# Patient Record
Sex: Female | Born: 1985
Health system: Southern US, Community
[De-identification: ages and names within clinical notes are randomized; demographics above are authoritative.]

## PROBLEM LIST (undated history)

## (undated) DIAGNOSIS — I10 Essential (primary) hypertension: Secondary | ICD-10-CM

## (undated) DIAGNOSIS — F32A Depression, unspecified: Secondary | ICD-10-CM

## (undated) DIAGNOSIS — F419 Anxiety disorder, unspecified: Secondary | ICD-10-CM

## (undated) DIAGNOSIS — N809 Endometriosis, unspecified: Secondary | ICD-10-CM

## (undated) DIAGNOSIS — F329 Major depressive disorder, single episode, unspecified: Secondary | ICD-10-CM

---

## 2009-05-12 ENCOUNTER — Emergency Department (HOSPITAL_COMMUNITY): Admission: EM | Admit: 2009-05-12 | Discharge: 2009-05-12 | Payer: Self-pay | Admitting: Emergency Medicine

## 2013-10-03 DIAGNOSIS — R8769 Abnormal cytological findings in specimens from other female genital organs: Secondary | ICD-10-CM | POA: Insufficient documentation

## 2014-01-03 DIAGNOSIS — N7093 Salpingitis and oophoritis, unspecified: Secondary | ICD-10-CM | POA: Insufficient documentation

## 2014-07-06 ENCOUNTER — Emergency Department (HOSPITAL_COMMUNITY)
Admission: EM | Admit: 2014-07-06 | Discharge: 2014-07-07 | Disposition: A | Discharge status: Home / Self Care | Payer: BC Managed Care – PPO | Attending: Emergency Medicine | Admitting: Emergency Medicine

## 2014-07-06 ENCOUNTER — Emergency Department (HOSPITAL_COMMUNITY): Payer: BC Managed Care – PPO

## 2014-07-06 ENCOUNTER — Encounter (HOSPITAL_COMMUNITY): Payer: Self-pay | Admitting: Emergency Medicine

## 2014-07-06 DIAGNOSIS — R079 Chest pain, unspecified: Secondary | ICD-10-CM | POA: Diagnosis present

## 2014-07-06 DIAGNOSIS — I10 Essential (primary) hypertension: Secondary | ICD-10-CM | POA: Insufficient documentation

## 2014-07-06 DIAGNOSIS — Z7901 Long term (current) use of anticoagulants: Secondary | ICD-10-CM | POA: Diagnosis not present

## 2014-07-06 DIAGNOSIS — R7989 Other specified abnormal findings of blood chemistry: Secondary | ICD-10-CM | POA: Insufficient documentation

## 2014-07-06 DIAGNOSIS — Z79899 Other long term (current) drug therapy: Secondary | ICD-10-CM | POA: Diagnosis not present

## 2014-07-06 DIAGNOSIS — R1110 Vomiting, unspecified: Secondary | ICD-10-CM | POA: Diagnosis not present

## 2014-07-06 DIAGNOSIS — I4891 Unspecified atrial fibrillation: Secondary | ICD-10-CM | POA: Insufficient documentation

## 2014-07-06 DIAGNOSIS — Z8742 Personal history of other diseases of the female genital tract: Secondary | ICD-10-CM | POA: Insufficient documentation

## 2014-07-06 HISTORY — DX: Essential (primary) hypertension: I10

## 2014-07-06 HISTORY — DX: Endometriosis, unspecified: N80.9

## 2014-07-06 LAB — I-STAT TROPONIN, ED: TROPONIN I, POC: 0 ng/mL (ref 0.00–0.08)

## 2014-07-06 LAB — BASIC METABOLIC PANEL
ANION GAP: 14 (ref 5–15)
BUN: 10 mg/dL (ref 6–23)
CO2: 23 mEq/L (ref 19–32)
Calcium: 9.3 mg/dL (ref 8.4–10.5)
Chloride: 106 mEq/L (ref 96–112)
Creatinine, Ser: 0.69 mg/dL (ref 0.50–1.10)
GFR calc Af Amer: >90 mL/min (ref >90)
GFR calc non Af Amer: >90 mL/min (ref >90)
GLUCOSE: 125 mg/dL — AB (ref 70–99)
POTASSIUM: 4 meq/L (ref 3.7–5.3)
SODIUM: 143 meq/L (ref 137–147)

## 2014-07-06 LAB — CBC
HCT: 40.1 % (ref 36.0–46.0)
HEMOGLOBIN: 13.6 g/dL (ref 12.0–15.0)
MCH: 27.4 pg (ref 26.0–34.0)
MCHC: 33.9 g/dL (ref 30.0–36.0)
MCV: 80.8 fL (ref 78.0–100.0)
PLATELETS: ADEQUATE 10*3/uL (ref 150–400)
RBC: 4.96 MIL/uL (ref 3.87–5.11)
RDW: 14.4 % (ref 11.5–15.5)
WBC: 12.1 10*3/uL — ABNORMAL HIGH (ref 4.0–10.5)

## 2014-07-06 LAB — D-DIMER, QUANTITATIVE: D-Dimer, Quant: 0.64 ug/mL-FEU — ABNORMAL HIGH (ref 0.00–0.48)

## 2014-07-06 LAB — MAGNESIUM: Magnesium: 1.8 mg/dL (ref 1.5–2.5)

## 2014-07-06 LAB — PROTIME-INR
INR: 1.22 (ref 0.00–1.49)
Prothrombin Time: 15.5 seconds — ABNORMAL HIGH (ref 11.6–15.2)

## 2014-07-06 LAB — PRO B NATRIURETIC PEPTIDE: PRO B NATRI PEPTIDE: 196.7 pg/mL — AB (ref 0–125)

## 2014-07-06 LAB — TSH: TSH: 3.1 u[IU]/mL (ref 0.350–4.500)

## 2014-07-06 MED ORDER — DILTIAZEM HCL 100 MG IV SOLR
5.0000 mg/h | Freq: Once | INTRAVENOUS | Status: AC
  Administered 2014-07-06: 5 mg/h via INTRAVENOUS

## 2014-07-06 MED ORDER — DILTIAZEM HCL 25 MG/5ML IV SOLN
15.0000 mg | Freq: Once | INTRAVENOUS | Status: AC
  Administered 2014-07-06: 15 mg via INTRAVENOUS

## 2014-07-06 MED ORDER — SODIUM CHLORIDE 0.9 % IV BOLUS (SEPSIS)
1000.0000 mL | Freq: Once | INTRAVENOUS | Status: AC
  Administered 2014-07-06: 1000 mL via INTRAVENOUS

## 2014-07-06 MED ORDER — IOHEXOL 350 MG/ML SOLN
80.0000 mL | Freq: Once | INTRAVENOUS | Status: AC | PRN
  Administered 2014-07-06: 80 mL via INTRAVENOUS

## 2014-07-06 MED ORDER — DILTIAZEM HCL 30 MG PO TABS
30.0000 mg | ORAL_TABLET | Freq: Once | ORAL | Status: AC
  Administered 2014-07-07: 30 mg via ORAL
  Filled 2014-07-06: qty 1

## 2014-07-06 NOTE — ED Provider Notes (Signed)
CSN: [id number]     Arrival date & time 07/06/14  1943 History   First MD Initiated Contact with Patient 07/06/14 2017     Chief Complaint  Patient presents with  . Chest Pain    (Consider location/radiation/quality/duration/timing/severity/associated sxs/prior Treatment) HPI Comments: Patient is a 28 year old female with a history of hypertension and endometriosis who presents to the emergency department for further evaluation of chest pain. Patient states that she came home at 0500 today and experienced one episode of emesis at 0600. She states that she then went to sleep and awoke later with a sharp pain in her left chest. She denies any radiation of the pain. Symptoms associated with palpitations characterized as a rapid heart beat. She states that she laid down to try and alleviate symptoms. She states that this improved her pain a bit, but she has noticed that her heart has been feeding at different rates throughout the course of the day. Patient states, "it feels like I'm running sometimes even when I'm standing still". She states that she was able to take a nap at 1800 and that her chest pain did not awake her with any discomfort. Patient has felt fatigued over the course of the day. She denies associated fever, syncope, near syncope, lightheadedness, dizziness, further nausea or vomiting, and shortness of breath. Patient with no history of recent travel, surgeries, or hospitalizations. She states that she is on hormone therapy since August 2015 for treatment of endometriosis; she cannot think of the name of the medication that she takes. Patient denies a history of arrhythmia or congenital heart abnormalities. Regarding FHx, she states that her father has a hx of a "weak heart muscles", but has never been on blood thinners.  Patient is a 28 y.o. female presenting with chest pain. The history is provided by the patient. No language interpreter was used.  Chest Pain Associated symptoms:  palpitations and vomiting   Associated symptoms: no dizziness, no fever, no nausea, no numbness, no shortness of breath and no weakness     Past Medical History  Diagnosis Date  . Hypertension   . Endometriosis    History reviewed. No pertinent past surgical history. No family history on file. History  Substance Use Topics  . Smoking status: Never Smoker   . Smokeless tobacco: Not on file  . Alcohol Use: No   OB History   Grav Para Term Preterm Abortions TAB SAB Ect Mult Living                  Review of Systems  Constitutional: Negative for fever.  Respiratory: Negative for shortness of breath.   Cardiovascular: Positive for chest pain and palpitations. Negative for leg swelling.  Gastrointestinal: Positive for vomiting. Negative for nausea.  Neurological: Negative for dizziness, syncope, weakness, light-headedness and numbness.  All other systems reviewed and are negative.   Allergies  Review of patient's allergies indicates no known allergies.  Home Medications   Prior to Admission medications   Medication Sig Start Date End Date Taking? Authorizing Provider  diltiazem (CARDIZEM) 30 MG tablet Take 1 tablet (30 mg total) by mouth 4 (four) times daily. 07/07/14   Antonietta Breach, PA-C  hydrochlorothiazide (HYDRODIURIL) 25 MG tablet Take 25 mg by mouth daily.   Yes Historical Provider, MD  norethindrone (AYGESTIN) 5 MG tablet Take 10 mg by mouth daily.  05/02/14  Yes Historical Provider, MD  rivaroxaban (XARELTO) 20 MG TABS tablet Take 1 tablet (20 mg total) by mouth daily with  supper. 07/07/14   Antonietta Breach, PA-C   BP 145/95  Pulse 108  Temp(Src) 98.1 F (36.7 C) (Oral)  Resp 18  Ht 5\' 7"  (1.702 m)  Wt 289 lb (131.09 kg)  BMI 45.25 kg/m2  SpO2 100%  Physical Exam  Nursing note and vitals reviewed. Constitutional: She is oriented to person, place, and time. She appears well-developed and well-nourished. No distress.   Nontoxic/nonseptic appearing  HENT:  Head:  Normocephalic and atraumatic.  Eyes: Conjunctivae and EOM are normal. No scleral icterus.  Neck: Normal range of motion. Neck supple.  No JVD  Cardiovascular: Normal heart sounds and intact distal pulses.  An irregularly irregular rhythm present.  Rate, by palpation, varies between regular and tachycardic  Pulmonary/Chest: Effort normal and breath sounds normal. No respiratory distress. She has no wheezes. She has no rales.  Chest expansion symmetric. No tachypnea or dyspnea.  Musculoskeletal: Normal range of motion.  Neurological: She is alert and oriented to person, place, and time. She exhibits normal muscle tone. Coordination normal.  Skin: Skin is warm and dry. No rash noted. She is not diaphoretic. No erythema. No pallor.  Psychiatric: She has a normal mood and affect. Her behavior is normal.    ED Course  Procedures (including critical care time) Labs Review Labs Reviewed  CBC - Abnormal; Notable for the following:    WBC 12.1 (*)    All other components within normal limits  BASIC METABOLIC PANEL - Abnormal; Notable for the following:    Glucose, Bld 125 (*)    All other components within normal limits  PRO B NATRIURETIC PEPTIDE - Abnormal; Notable for the following:    Pro B Natriuretic peptide (BNP) 196.7 (*)    All other components within normal limits  D-DIMER, QUANTITATIVE - Abnormal; Notable for the following:    D-Dimer, Quant 0.64 (*)    All other components within normal limits  PROTIME-INR - Abnormal; Notable for the following:    Prothrombin Time 15.5 (*)    All other components within normal limits  MAGNESIUM  TSH  I-STAT TROPOININ, ED    Imaging Review Dg Chest 2 View  07/06/2014   CLINICAL DATA:  Chest pain.  Initial encounter  EXAM: CHEST  2 VIEW  COMPARISON:  None.  FINDINGS: Generous heart size with normal morphology. Negative upper mediastinal contours. There is no edema, consolidation, effusion, or pneumothorax. No osseous findings to explain chest  pain.  IMPRESSION: 1. No active cardiopulmonary disease. 2. Borderline cardiomegaly.   Electronically Signed   By: Jorje Guild M.D.   On: 07/06/2014 20:30   Ct Angio Chest Pe W/cm &/or Wo Cm  07/07/2014   CLINICAL DATA:  Initial evaluation for intermittent left chest pain with palpitations and shortness of breath.  EXAM: CT ANGIOGRAPHY CHEST WITH CONTRAST  TECHNIQUE: Multidetector CT imaging of the chest was performed using the standard protocol during bolus administration of intravenous contrast. Multiplanar CT image reconstructions and MIPs were obtained to evaluate the vascular anatomy.  CONTRAST:  55mL OMNIPAQUE IOHEXOL 350 MG/ML SOLN  COMPARISON:  Prior radiograph performed earlier on the same day.  FINDINGS: Thyroid gland within normal limits.  No pathologically enlarged mediastinal, hilar, or axillary lymph nodes identified.  Intrathoracic aorta within normal limits. Great vessels are unremarkable.  Heart size is at the upper limits of normal. No pericardial effusion.  Pulmonary arterial tree is well opacified. No filling defect to suggest acute pulmonary embolism. Re-formatted imaging confirms these findings.  The lungs are clear  without focal infiltrate or pulmonary edema. Mild subsegmental atelectasis seen dependently within the bilateral lower lobes. No pneumothorax. No worrisome pulmonary nodule or mass.  Visualized portions of the upper abdomen within normal limits.  No acute osseous abnormality. No worrisome lytic or blastic osseous lesions.  Review of the MIP images confirms the above findings.  IMPRESSION: No CT evidence for acute pulmonary embolism or other acute cardiopulmonary abnormality.   Electronically Signed   By: Jeannine Boga M.D.   On: 07/07/2014 00:31     EKG Interpretation None      CRITICAL CARE Performed by: Antonietta Breach   Total critical care time: 45  Critical care time was exclusive of separately billable procedures and treating other  patients.  Critical care was necessary to treat or prevent imminent or life-threatening deterioration.  Critical care was time spent personally by me on the following activities: development of treatment plan with patient and/or surrogate as well as nursing, discussions with consultants, evaluation of patient's response to treatment, examination of patient, obtaining history from patient or surrogate, ordering and performing treatments and interventions, ordering and review of laboratory studies, ordering and review of radiographic studies, pulse oximetry and re-evaluation of patient's condition.  MDM   Final diagnoses:  Positive D dimer  Atrial fibrillation with RVR    28 y/o female presents to the ED for palpitations and chest pressure. Work up c/w Afib with RVR. Patient with rate control after Cardizem bolus and drip. Patient transitioned to oral Cardizem with continued rate control. Cardiac work up is reassuring; troponin WNL. No evidence of pulmonary embolus. TSH normal. Unknown cause of sudden onset of Afib.   Have consulted with Dr. Claiborne Billings of Cardiology. Patient elects to follow up with cardiology as an outpatient and Dr. Claiborne Billings, who has seen and evaluated the patient, believes patient is stable to do so. He has recommended initiation with Cardizem as well as Xarelto given that patient has a CHADs score of 2 for HTN and, likely, mild cardiomegaly and elevated BNP suggesting early CHF. Have instructed the patient to call cardiology in the morning as well as to return to the ED should she begin to experience similar symptoms again as she will likely require admission at this time. Patient agreeable to plan with no unaddressed concerns.   Filed Vitals:   07/07/14 0000 07/07/14 0033 07/07/14 0100 07/07/14 0229  BP: 140/86 128/75 153/99 145/95  Pulse: 104 87 99 108  Temp:    98.1 F (36.7 C)  TempSrc:    Oral  Resp: 18 18 18 18   Height:      Weight:      SpO2: 100% 100% 100% 100%       Antonietta Breach, PA-C 07/07/14 0251

## 2014-07-06 NOTE — ED Notes (Signed)
Pt. reports intermittent left chest pain with palpitations onset today with mild SOB and emesis yesterday .

## 2014-07-06 NOTE — ED Notes (Signed)
Pt ambulatory with steady gait from bathroom

## 2014-07-07 MED ORDER — DILTIAZEM HCL 30 MG PO TABS
30.0000 mg | ORAL_TABLET | Freq: Four times a day (QID) | ORAL | Status: DC
Start: 2014-07-07 — End: 2014-07-15

## 2014-07-07 MED ORDER — RIVAROXABAN (XARELTO) EDUCATION KIT FOR DVT/PE PATIENTS
PACK | Freq: Once | Status: DC
  Filled 2014-07-07: qty 1

## 2014-07-07 MED ORDER — RIVAROXABAN 20 MG PO TABS
20.0000 mg | ORAL_TABLET | Freq: Every day | ORAL | Status: DC
Start: 2014-07-07 — End: 2014-07-15

## 2014-07-07 NOTE — ED Notes (Signed)
Cardiology at bedside.

## 2014-07-07 NOTE — Discharge Instructions (Signed)

## 2014-07-07 NOTE — Consult Note (Signed)
Reason for Consult: new onset atrial fibrillation Referring Physician: ER   Cathy Bryan is an 28 y.o. female.  HPI: Ms. Cathy Bryan is a 28 yo woman with PMH of HTN and endometriosis on progesterone who presents with chest pain/palpitations today. She came home at 0500 and had some nausea/vomiting at 06:00 after a night of some drinking and marijuana (only occasional use). She slept in and woke up with some sharp pain in her chest as well as fast rapid heart rate/palpitations. She's never had symptoms like this before. She tried sleeping and rest throughout the day without improvement leading to ER presentation. No infectious symptoms, no sick contacts, no fever/chills/diarrhea. She's been on proegsterone since August 2015 for endometriosis. No family history of CAD or atrial fibrillation. No bleeding issues. We discussed the causes/triggers of atrial fibrillation, risk factors (stroke) and typical treatment. She was accompanied by her girlfriend and a woman she considers her second mother.   Past Medical History  Diagnosis Date  . Hypertension   . Endometriosis     History reviewed. No pertinent past surgical history.  No family history on file.  Social History:  reports that she has never smoked. She does not have any smokeless tobacco history on file. She reports that she does not drink alcohol or use illicit drugs.  Allergies: No Known Allergies  Medications: I have reviewed the patient's current medications. Prior to Admission:  (Not in a hospital admission) Scheduled:  Results for orders placed during the hospital encounter of 07/06/14 (from the past 48 hour(s))  CBC     Status: Abnormal   Collection Time    07/06/14  8:10 PM      Result Value Ref Range   WBC 12.1 (*) 4.0 - 10.5 K/uL   Comment: WHITE COUNT CONFIRMED ON SMEAR   RBC 4.96  3.87 - 5.11 MIL/uL   Hemoglobin 13.6  12.0 - 15.0 g/dL   HCT 40.1  36.0 - 46.0 %   MCV 80.8  78.0 - 100.0 fL   MCH 27.4  26.0 - 34.0  pg   MCHC 33.9  30.0 - 36.0 g/dL   RDW 14.4  11.5 - 15.5 %   Platelets    150 - 400 K/uL   Value: PLATELET CLUMPS NOTED ON SMEAR, COUNT APPEARS ADEQUATE  BASIC METABOLIC PANEL     Status: Abnormal   Collection Time    07/06/14  8:10 PM      Result Value Ref Range   Sodium 143  137 - 147 mEq/L   Potassium 4.0  3.7 - 5.3 mEq/L   Chloride 106  96 - 112 mEq/L   CO2 23  19 - 32 mEq/L   Glucose, Bld 125 (*) 70 - 99 mg/dL   BUN 10  6 - 23 mg/dL   Creatinine, Ser 0.69  0.50 - 1.10 mg/dL   Calcium 9.3  8.4 - 10.5 mg/dL   GFR calc non Af Amer >90  >90 mL/min   GFR calc Af Amer >90  >90 mL/min   Comment: (NOTE)     The eGFR has been calculated using the CKD EPI equation.     This calculation has not been validated in all clinical situations.     eGFR's persistently <90 mL/min signify possible Chronic Kidney     Disease.   Anion gap 14  5 - 15  PRO B NATRIURETIC PEPTIDE     Status: Abnormal   Collection Time    07/06/14  8:10 PM  Result Value Ref Range   Pro B Natriuretic peptide (BNP) 196.7 (*) 0 - 125 pg/mL  I-STAT TROPOININ, ED     Status: None   Collection Time    07/06/14  8:19 PM      Result Value Ref Range   Troponin i, poc 0.00  0.00 - 0.08 ng/mL   Comment 3            Comment: Due to the release kinetics of cTnI,     a negative result within the first hours     of the onset of symptoms does not rule out     myocardial infarction with certainty.     If myocardial infarction is still suspected,     repeat the test at appropriate intervals.  D-DIMER, QUANTITATIVE     Status: Abnormal   Collection Time    07/06/14  8:51 PM      Result Value Ref Range   D-Dimer, Quant 0.64 (*) 0.00 - 0.48 ug/mL-FEU   Comment:            AT THE INHOUSE ESTABLISHED CUTOFF     VALUE OF 0.48 ug/mL FEU,     THIS ASSAY HAS BEEN DOCUMENTED     IN THE LITERATURE TO HAVE     A SENSITIVITY AND NEGATIVE     PREDICTIVE VALUE OF AT LEAST     98 TO 99%.  THE TEST RESULT     SHOULD BE CORRELATED  WITH     AN ASSESSMENT OF THE CLINICAL     PROBABILITY OF DVT / VTE.  PROTIME-INR     Status: Abnormal   Collection Time    07/06/14  9:40 PM      Result Value Ref Range   Prothrombin Time 15.5 (*) 11.6 - 15.2 seconds   INR 1.22  0.00 - 1.49  MAGNESIUM     Status: None   Collection Time    07/06/14  9:40 PM      Result Value Ref Range   Magnesium 1.8  1.5 - 2.5 mg/dL  TSH     Status: None   Collection Time    07/06/14  9:40 PM      Result Value Ref Range   TSH 3.100  0.350 - 4.500 uIU/mL    Dg Chest 2 View  07/06/2014   CLINICAL DATA:  Chest pain.  Initial encounter  EXAM: CHEST  2 VIEW  COMPARISON:  None.  FINDINGS: Generous heart size with normal morphology. Negative upper mediastinal contours. There is no edema, consolidation, effusion, or pneumothorax. No osseous findings to explain chest pain.  IMPRESSION: 1. No active cardiopulmonary disease. 2. Borderline cardiomegaly.   Electronically Signed   By: Jorje Guild M.D.   On: 07/06/2014 20:30   Ct Angio Chest Pe W/cm &/or Wo Cm  07/07/2014   CLINICAL DATA:  Initial evaluation for intermittent left chest pain with palpitations and shortness of breath.  EXAM: CT ANGIOGRAPHY CHEST WITH CONTRAST  TECHNIQUE: Multidetector CT imaging of the chest was performed using the standard protocol during bolus administration of intravenous contrast. Multiplanar CT image reconstructions and MIPs were obtained to evaluate the vascular anatomy.  CONTRAST:  12m OMNIPAQUE IOHEXOL 350 MG/ML SOLN  COMPARISON:  Prior radiograph performed earlier on the same day.  FINDINGS: Thyroid gland within normal limits.  No pathologically enlarged mediastinal, hilar, or axillary lymph nodes identified.  Intrathoracic aorta within normal limits. Great vessels are unremarkable.  Heart size is at the upper  limits of normal. No pericardial effusion.  Pulmonary arterial tree is well opacified. No filling defect to suggest acute pulmonary embolism. Re-formatted imaging  confirms these findings.  The lungs are clear without focal infiltrate or pulmonary edema. Mild subsegmental atelectasis seen dependently within the bilateral lower lobes. No pneumothorax. No worrisome pulmonary nodule or mass.  Visualized portions of the upper abdomen within normal limits.  No acute osseous abnormality. No worrisome lytic or blastic osseous lesions.  Review of the MIP images confirms the above findings.  IMPRESSION: No CT evidence for acute pulmonary embolism or other acute cardiopulmonary abnormality.   Electronically Signed   By: Jeannine Boga M.D.   On: 07/07/2014 00:31    Review of Systems  Constitutional: Negative for fever, chills, malaise/fatigue and diaphoresis.  HENT: Negative for hearing loss and tinnitus.   Eyes: Negative for double vision, photophobia and pain.  Respiratory: Negative for cough and shortness of breath.   Cardiovascular: Positive for palpitations. Negative for chest pain, orthopnea, claudication and leg swelling.  Gastrointestinal: Positive for vomiting. Negative for abdominal pain, diarrhea, blood in stool and melena.  Genitourinary: Negative for dysuria and flank pain.  Musculoskeletal: Negative for joint pain and myalgias.  Neurological: Negative for dizziness and headaches.  Endo/Heme/Allergies: Negative for polydipsia. Does not bruise/bleed easily.  Psychiatric/Behavioral: Negative for depression, suicidal ideas and hallucinations.   Blood pressure 153/99, pulse 99, temperature 98.7 F (37.1 C), temperature source Oral, resp. rate 18, height 5' 7"  (1.702 m), weight 131.09 kg (289 lb), SpO2 100.00%. Physical Exam  Nursing note and vitals reviewed. Constitutional: She is oriented to person, place, and time. She appears well-developed and well-nourished. No distress.  HENT:  Head: Normocephalic and atraumatic.  Nose: Nose normal.  Mouth/Throat: Oropharynx is clear and moist. No oropharyngeal exudate.  Eyes: Conjunctivae and EOM are  normal. Pupils are equal, round, and reactive to light. No scleral icterus.  Neck: Normal range of motion. Neck supple. No JVD present. No tracheal deviation present.  Cardiovascular: Normal heart sounds and intact distal pulses.  Exam reveals no gallop.   No murmur heard. Irregularly irregular HR 90s-100s  Respiratory: Effort normal and breath sounds normal. No respiratory distress. She has no wheezes. She has no rales.  GI: Soft. Bowel sounds are normal. She exhibits no distension. There is no tenderness. There is no rebound.  Musculoskeletal: Normal range of motion. She exhibits no edema and no tenderness.  Neurological: She is alert and oriented to person, place, and time. No cranial nerve deficit. Coordination normal.  Skin: Skin is warm and dry. No rash noted. She is not diaphoretic. No erythema.  Psychiatric: She has a normal mood and affect. Her behavior is normal. Thought content normal.  labs reviewed; tsh unrevealing D-dimer 0.64, CTA unrevealing for PE ECG: atrial fibrillation Assessment/Plan: Cathy Bryan is a 28 yo woman with hypertension and endometriosis who presents with palpitations after a late night out partying who is found to have new onset atrial fibrillation. I discussed the risks of atrial fibrillation (stroke) and etiologies including thyroid disorders, infections, toxins, heart failure, among other etiologies. I recommended inpatient stay to pursue TEE-DCCV given first occurrence of atrial fibrillation but she wants to discharge and return soon for follow-up. I am hopeful she will spontaneously convert. Her CHADS2VASC is 2 (female and hypertension) so I recommend xarelto given once daily administration.  - follow-up ASAP, patient has number to call to confirm appointment - rate control with diltiazem - anticoagulation with xarelto - TTE at time of  follow-up to assess EF   Hiliary Osorto 07/07/2014, 1:26 AM

## 2014-07-07 NOTE — ED Notes (Signed)
Pt speaking with pharmacist via telephone regarding new prescription for xarelto

## 2014-07-07 NOTE — ED Notes (Signed)
Dr. Nanavati at bedside 

## 2014-07-07 NOTE — ED Provider Notes (Signed)
Medical screening examination/treatment/procedure(s) were conducted as a shared visit with non-physician practitioner(s) and myself.  I personally evaluated the patient during the encounter.   EKG Interpretation   Date/Time:  Sunday July 06 2014 22:49:55 EDT Ventricular Rate:  81 PR Interval:    QRS Duration: 87 QT Interval:  359 QTC Calculation: 417 R Axis:   79 Text Interpretation:  Atrial fibrillation Borderline repolarization  abnormality Improved rate, with flutter waves Reconfirmed by Khayri Kargbo, MD,  Marissa Weaver ([id number]) on 07/07/2014 9:50:17 AM       Pt comes in with chest discomfort. Noted to be in afib with RVR at rate of 150. Pt given dilt bolus, started on drip there after. HR improved to < 110. Drip weaned off whilst in the ER, and oral cardiazem started, and HR maintained to < 100. CHADS2 score is 1 for sure, for HTN, however, BNP was elevated, CHF can't be ruled out, and so pt was given Xarelto. Cards OK with discharge, and close f/u, and i spoke with the Cardiology team this AM, to get pt in the clinic soon. Return precautions discussed, including return of palpitations. Pt also advised on risk of stroke being higher, and complications of xarelto. Will d.c.  CRITICAL CARE Performed by: Varney Biles   Total critical care time: 32 min  Critical care time was exclusive of separately billable procedures and treating other patients.  Critical care was necessary to treat or prevent imminent or life-threatening deterioration.  Critical care was time spent personally by me on the following activities: development of treatment plan with patient and/or surrogate as well as nursing, discussions with consultants, evaluation of patient's response to treatment, examination of patient, obtaining history from patient or surrogate, ordering and performing treatments and interventions, ordering and review of laboratory studies, ordering and review of radiographic studies, pulse  oximetry and re-evaluation of patient's condition.   Varney Biles, MD 07/07/14 [phone]

## 2014-07-10 ENCOUNTER — Encounter: Payer: Self-pay | Admitting: *Deleted

## 2014-07-15 ENCOUNTER — Ambulatory Visit (INDEPENDENT_AMBULATORY_CARE_PROVIDER_SITE_OTHER): Payer: BC Managed Care – PPO | Admitting: Internal Medicine

## 2014-07-15 ENCOUNTER — Encounter: Payer: Self-pay | Admitting: Internal Medicine

## 2014-07-15 VITALS — BP 130/96 | HR 87 | Ht 67.0 in | Wt 285.2 lb

## 2014-07-15 DIAGNOSIS — I4891 Unspecified atrial fibrillation: Secondary | ICD-10-CM

## 2014-07-15 DIAGNOSIS — G478 Other sleep disorders: Secondary | ICD-10-CM

## 2014-07-15 DIAGNOSIS — G4730 Sleep apnea, unspecified: Secondary | ICD-10-CM

## 2014-07-15 DIAGNOSIS — I10 Essential (primary) hypertension: Secondary | ICD-10-CM

## 2014-07-15 LAB — HEMOGLOBIN A1C: Hgb A1c MFr Bld: 5.7 % (ref 4.6–6.5)

## 2014-07-15 NOTE — Patient Instructions (Addendum)
Your physician has recommended you make the following change in your medication:  1) STOP Diltiazem 2) STOP Xarelto  Lab today: A1C  Your physician has requested that you have an echocardiogram. Echocardiography is a painless test that uses sound waves to create images of your heart. It provides your doctor with information about the size and shape of your heart and how well your heart's chambers and valves are working. This procedure takes approximately one hour. There are no restrictions for this procedure.  We will call you with the results.  We will see you on an as needed basis.  Continue with sleep study plans. Increase exercise. Weight loss.

## 2014-07-15 NOTE — Progress Notes (Signed)
ELECTROPHYSIOLOGY CONSULT NOTE  Patient ID: Cathy Bryan, MRN: [id number], DOB/AGE: [birth year] 28 y.o. Admit date: (Not on file) Date of Consult: 07/15/2014  Primary Physician: No PCP Per Patient Primary Cardiologist: None  Chief Complaint: Atrial Fibrillation   HPI Cathy Bryan is a 28 y.o. female with history of morbid obesity, HTN, snoring, who presented to the ER, 10/25  for complaints of tachycardia and chest pain. Pt was first aware of symptoms upon awaking after a night of binge drinking and marijuana use. She states this is not her usual behavior but had attended homing the night before. She was placed on Xarelto and diltiazem and d/c in rate controlled afib. She noticed the next day that heart rhythm felt normal and has noticed any further arrhythmia. She saw an ENT and is scheduled for a sleep study. Labs in the ER revealed glucose (nonfasting ) of 125, TSH, magnesium,troponin, H&H nml, with positive d-dimer but nml CT, BNP slightly elevated at 196.7. It was suggested to obtain echo on an OP basis. She was discharged on diltiazem 30 qid as well as Xarelto 20 mg qd for CHA2DS2VASc of 2(Htn,female) . Past Medical History  Diagnosis Date  . Hypertension   . Endometriosis       Surgical History: History reviewed. No pertinent past surgical history.   Home Meds: Prior to Admission medications   Medication Sig Start Date End Date Taking? Authorizing Provider  diltiazem (CARDIZEM) 30 MG tablet Take 1 tablet (30 mg total) by mouth 4 (four) times daily. 07/07/14  Yes Antonietta Breach, PA-C  hydrochlorothiazide (HYDRODIURIL) 25 MG tablet Take 25 mg by mouth daily.   Yes Historical Provider, MD  norethindrone (AYGESTIN) 5 MG tablet Take 10 mg by mouth daily.  05/02/14  Yes Historical Provider, MD  rivaroxaban (XARELTO) 20 MG TABS tablet Take 1 tablet (20 mg total) by mouth daily with supper. 07/07/14  Yes Antonietta Breach, PA-C    Inpatient Medications:  Diltiazem IV  Allergies: No Known  Allergies  History   Social History  . Marital Status: Single    Spouse Name: N/A    Number of Children: N/A  . Years of Education: N/A   Occupational History  . Not on file.   Social History Main Topics  . Smoking status: Never Smoker   . Smokeless tobacco: Not on file  . Alcohol Use: Yes  . Drug Use: Yes    Special: Marijuana  . Sexual Activity: Not on file   Other Topics Concern  . Not on file   Social History Narrative     Family History  Problem Relation Age of Onset  . Hypertension Mother   . Hypertension Father   . Diabetes Father      ROS:  Please see the history of present illness.   History obtained from chart review and the patient  All other systems reviewed and negative.    Physical Exam: Blood pressure 130/96, pulse 87, height 5\' 7"  (1.702 m), weight 285 lb 3.2 oz (129.366 kg). General: Well developed, well nourished female in no acute distress. Head: Normocephalic, atraumatic, sclera non-icteric, no xanthomas, nares are without discharge. EENT: normal Lymph Nodes:  none Back: without scoliosis/kyphosis, no CVA tendersness Neck: Negative for carotid bruits. JVD not elevated. Lungs: Clear bilaterally to auscultation without wheezes, rales, or rhonchi. Breathing is unlabored. Heart: RRR with S1 S2. No murmur,no rubs, or gallops appreciated. Abdomen: Soft, non-tender, non-distended with normoactive bowel sounds. No hepatomegaly. No rebound/guarding. No obvious abdominal masses. Msk:  Strength and tone appear normal for age. Extremities: No clubbing or cyanosis. Trace lower extremity edema.  Distal pedal pulses are 2+ and equal bilaterally. Skin: Warm and Dry Neuro: Alert and oriented X 3. CN III-XII intact Grossly normal sensory and motor function . Psych:  Responds to questions appropriately with a normal affect.      Labs: Cardiac Enzymes No results for input(s): CKTOTAL, CKMB, TROPONINI in the last 72 hours. CBC Lab Results  Component Value  Date   WBC 12.1* 07/06/2014   HGB 13.6 07/06/2014   HCT 40.1 07/06/2014   MCV 80.8 07/06/2014   PLT  07/06/2014    PLATELET CLUMPS NOTED ON SMEAR, COUNT APPEARS ADEQUATE   PROTIME: No results for input(s): LABPROT, INR in the last 72 hours. Chemistry No results for input(s): NA, K, CL, CO2, BUN, CREATININE, CALCIUM, PROT, BILITOT, ALKPHOS, ALT, AST, GLUCOSE in the last 168 hours.  Invalid input(s): LABALBU Lipids No results found for: CHOL, HDL, LDLCALC, TRIG BNP PRO B NATRIURETIC PEPTIDE (BNP)  Date/Time Value Ref Range Status  07/06/2014 08:10 PM 196.7* 0 - 125 pg/mL Final   Miscellaneous Lab Results  Component Value Date   DDIMER 0.64* 07/06/2014    Radiology/Studies:  Dg Chest 2 View  07/06/2014   CLINICAL DATA:  Chest pain.  Initial encounter  EXAM: CHEST  2 VIEW  COMPARISON:  None.  FINDINGS: Generous heart size with normal morphology. Negative upper mediastinal contours. There is no edema, consolidation, effusion, or pneumothorax. No osseous findings to explain chest pain.  IMPRESSION: 1. No active cardiopulmonary disease. 2. Borderline cardiomegaly.   Electronically Signed   By: Jorje Guild M.D.   On: 07/06/2014 20:30   Ct Angio Chest Pe W/cm &/or Wo Cm  07/07/2014   CLINICAL DATA:  Initial evaluation for intermittent left chest pain with palpitations and shortness of breath.  EXAM: CT ANGIOGRAPHY CHEST WITH CONTRAST  TECHNIQUE: Multidetector CT imaging of the chest was performed using the standard protocol during bolus administration of intravenous contrast. Multiplanar CT image reconstructions and MIPs were obtained to evaluate the vascular anatomy.  CONTRAST:  87mL OMNIPAQUE IOHEXOL 350 MG/ML SOLN  COMPARISON:  Prior radiograph performed earlier on the same day.  FINDINGS: Thyroid gland within normal limits.  No pathologically enlarged mediastinal, hilar, or axillary lymph nodes identified.  Intrathoracic aorta within normal limits. Great vessels are unremarkable.   Heart size is at the upper limits of normal. No pericardial effusion.  Pulmonary arterial tree is well opacified. No filling defect to suggest acute pulmonary embolism. Re-formatted imaging confirms these findings.  The lungs are clear without focal infiltrate or pulmonary edema. Mild subsegmental atelectasis seen dependently within the bilateral lower lobes. No pneumothorax. No worrisome pulmonary nodule or mass.  Visualized portions of the upper abdomen within normal limits.  No acute osseous abnormality. No worrisome lytic or blastic osseous lesions.  Review of the MIP images confirms the above findings.  IMPRESSION: No CT evidence for acute pulmonary embolism or other acute cardiopulmonary abnormality.   Electronically Signed   By: Jeannine Boga M.D.   On: 07/07/2014 00:31    EKG:NSR at 87 bpm.   Assessment and Plan: 1. PAF- currently in SR  Discussed in length lifestyle and contributing factors such as use of alcohol and illicit drugs, snoring history, obesity,inactivity and HTN.  Pt is pending sleep study  Echocardiogram to further assess for abnormal structure that would contribute to afib. Will be called report, returning   to office only if  abnormal.  HgA1C today  to assess for undiagnosed DM  Stop diltiazem  Stop NOAC- decision not to continue long term anticoagulation based on recent Article in [facility] -comparing ATRIA score to determine stroke risk vrs chads and CHA2DS2VASc,found to more accurate to identifiy low risk patients, preventing overuse of anticoagulants,, with HTN and sex assigned to low risk values as well as patients young age being protective.  See prn, returning for any recurrent afib.  Virl Axe

## 2014-07-18 ENCOUNTER — Ambulatory Visit (HOSPITAL_COMMUNITY): Payer: BC Managed Care – PPO | Attending: Internal Medicine | Admitting: Cardiology

## 2014-07-18 DIAGNOSIS — I4891 Unspecified atrial fibrillation: Secondary | ICD-10-CM | POA: Insufficient documentation

## 2014-07-18 NOTE — Progress Notes (Signed)
Echo performed. 

## 2014-08-11 ENCOUNTER — Ambulatory Visit (HOSPITAL_BASED_OUTPATIENT_CLINIC_OR_DEPARTMENT_OTHER): Payer: BC Managed Care – PPO

## 2014-08-26 ENCOUNTER — Ambulatory Visit (HOSPITAL_BASED_OUTPATIENT_CLINIC_OR_DEPARTMENT_OTHER): Payer: BC Managed Care – PPO | Attending: Otolaryngology | Admitting: Radiology

## 2014-08-26 DIAGNOSIS — G4710 Hypersomnia, unspecified: Secondary | ICD-10-CM

## 2014-08-26 DIAGNOSIS — Z6841 Body Mass Index (BMI) 40.0 and over, adult: Secondary | ICD-10-CM | POA: Insufficient documentation

## 2014-08-26 DIAGNOSIS — R0683 Snoring: Secondary | ICD-10-CM

## 2014-08-26 DIAGNOSIS — G4730 Sleep apnea, unspecified: Secondary | ICD-10-CM | POA: Insufficient documentation

## 2014-08-26 NOTE — Sleep Study (Signed)
THE PATIENT ARRIVED TO THE [facility] AT 1300 IN NOTED DISTRESS.THE PATIENT IS SCHEDULED TO UNDERGO A HSAT.THE PATIENT TECH DEMONSTRATED THE HOW TO APPLY THE DEVICE.THE PATIENT PRESENTED CLEAR UNDERSTANDING OF THE APPLICATION.THE PATIENT WAS INSTRUCTED TO RETURN THE DEVICE ON THE NEXT BUSINESS DAY. THE PATIENT AGREED.

## 2014-08-30 DIAGNOSIS — G4710 Hypersomnia, unspecified: Secondary | ICD-10-CM

## 2014-08-30 DIAGNOSIS — R0683 Snoring: Secondary | ICD-10-CM

## 2014-08-30 DIAGNOSIS — G4730 Sleep apnea, unspecified: Secondary | ICD-10-CM

## 2014-08-30 NOTE — Sleep Study (Signed)
    NAME: Cathy Bryan DATE OF BIRTH:  [birth year] MEDICAL RECORD NUMBER [id number]  LOCATION: [facility]  PHYSICIAN: YOUNG,CLINTON D  DATE OF STUDY: 08/26/2014  SLEEP STUDY TYPE: Out of Center Sleep Test                REFERRING PHYSICIAN: Jodi Marble, MD  INDICATION FOR STUDY: Hypersomnia with sleep apnea  EPWORTH SLEEPINESS SCORE:  9/24  HEIGHT:    5 feet 7 inches WEIGHT:    289 pounds   BMI 45 NECK SIZE:  15.5 in.  MED: Charted for review  IMPRESSION:   Moderate obstructive sleep apnea/hypopnea syndrome, AHI 19.3 per hour. 106 total events scored including 5 central apneas, 8 obstructive apneas, what apnea, 92 hypopneas. Sleep position was not specified. Oxygen desaturation to a nadir of 83% and mean saturation 96% on room air. Mean heart rate 85.1/m.    RECOMMENDATION:   Scores in this range are most commonly addressed first with CPAP. Other interventions may be appropriate on an individual basis.   Deneise Lever Diplomate, American Board of Sleep Medicine  ELECTRONICALLY SIGNED ON:  08/30/2014, 9:55 AM [place] PH: [phone]   FX: [phone] [place]

## 2014-09-11 ENCOUNTER — Other Ambulatory Visit (HOSPITAL_BASED_OUTPATIENT_CLINIC_OR_DEPARTMENT_OTHER): Payer: BC Managed Care – PPO

## 2014-09-11 ENCOUNTER — Ambulatory Visit (INDEPENDENT_AMBULATORY_CARE_PROVIDER_SITE_OTHER): Payer: BC Managed Care – PPO | Admitting: Family Medicine

## 2014-09-11 ENCOUNTER — Ambulatory Visit (INDEPENDENT_AMBULATORY_CARE_PROVIDER_SITE_OTHER): Payer: BC Managed Care – PPO

## 2014-09-11 VITALS — BP 142/98 | HR 100 | Temp 98.1°F | Resp 18 | Ht 67.0 in | Wt 287.6 lb

## 2014-09-11 DIAGNOSIS — R1031 Right lower quadrant pain: Secondary | ICD-10-CM

## 2014-09-11 DIAGNOSIS — Z8742 Personal history of other diseases of the female genital tract: Secondary | ICD-10-CM

## 2014-09-11 DIAGNOSIS — K625 Hemorrhage of anus and rectum: Secondary | ICD-10-CM

## 2014-09-11 DIAGNOSIS — D72829 Elevated white blood cell count, unspecified: Secondary | ICD-10-CM

## 2014-09-11 DIAGNOSIS — K5900 Constipation, unspecified: Secondary | ICD-10-CM

## 2014-09-11 LAB — POCT CBC
Granulocyte percent: 70.9 %G (ref 37–80)
HCT, POC: 46.1 % (ref 37.7–47.9)
HEMOGLOBIN: 14.9 g/dL (ref 12.2–16.2)
LYMPH, POC: 3.6 — AB (ref 0.6–3.4)
MCH: 27 pg (ref 27–31.2)
MCHC: 32.2 g/dL (ref 31.8–35.4)
MCV: 83.9 fL (ref 80–97)
MID (cbc): 0.9 (ref 0–0.9)
MPV: 9 fL (ref 0–99.8)
POC Granulocyte: 11 — AB (ref 2–6.9)
POC LYMPH PERCENT: 23.2 %L (ref 10–50)
POC MID %: 5.9 %M (ref 0–12)
Platelet Count, POC: 273 10*3/uL (ref 142–424)
RBC: 5.5 M/uL — AB (ref 4.04–5.48)
RDW, POC: 13.6 %
WBC: 15.5 10*3/uL — AB (ref 4.6–10.2)

## 2014-09-11 LAB — IFOBT (OCCULT BLOOD): IFOBT: NEGATIVE

## 2014-09-11 NOTE — Progress Notes (Signed)
Subjective: 36 show lady who has been having problems with being a little bit constipated the last few days. She has had some pain in the epigastrium and moves more down to the low abdomen. She hurts more on the right side. She does have a history of endometriosis. She has had no nausea or vomiting. She says is improved a little bit with her bowels been taking a lot of Pepto-Bismol and magnesium. This morning she noticed red blood with her stool when her bowels did move a little bit. She had to strain a lot initially, then moved a little.  Objective: Vital signs stable. No acute distress. Chest clear. Heart regular without murmurs. Abdomen has normal bowel sounds. Soft without masses or but has some mild generalized tenderness from the epigastrium and low abdomen and more in the right lower quadrant. . Rectal exam revealed a little spot of red just to the left the anus, but no source of bleeding. No hemorrhoids. Digital exam was palpably normal. However the examining glove does have some red blood in the mucus on it.  Assessment: Red rectal bleeding Constipation Abdominal pain, right lower quadrant  Plan: CBC, abdominal x-ray  Results for orders placed or performed in visit on 09/11/14  IFOBT POC (occult bld, rslt in office)  Result Value Ref Range   IFOBT Negative   POCT CBC  Result Value Ref Range   WBC 15.5 (A) 4.6 - 10.2 K/uL   Lymph, poc 3.6 (A) 0.6 - 3.4   POC LYMPH PERCENT 23.2 10 - 50 %L   MID (cbc) 0.9 0 - 0.9   POC MID % 5.9 0 - 12 %M   POC Granulocyte 11.0 (A) 2 - 6.9   Granulocyte percent 70.9 37 - 80 %G   RBC 5.50 (A) 4.04 - 5.48 M/uL   Hemoglobin 14.9 12.2 - 16.2 g/dL   HCT, POC 46.1 37.7 - 47.9 %   MCV 83.9 80 - 97 fL   MCH, POC 27.0 27 - 31.2 pg   MCHC 32.2 31.8 - 35.4 g/dL   RDW, POC 13.6 %   Platelet Count, POC 273.0 142 - 424 K/uL   MPV 9.0 0 - 99.8 fL   [facility] reading (PRIMARY) by  Dr. Linna Darner Obvious stool in ascending and transverse colon, radiopaque, probably  from all the Pepto-Bismol. No abnormal gas patterns noted.  Assessment: With the leukocytosis need to rule out an acute abdomen process. This is probably just from the bed constipation in the straining causing her bleeding somehow. She could have a diverticulitis though less likely at her age. Recent CT scan.  Patient reluctant to go get CT scan. It was a very long process getting the CT approved, and it was approved [facility] [address] [facility]. It sounds like the patient may not go there. I told her I cannot make her go, but also her appendix could rupture and she did get very severely ill. She was urged strongly to go.

## 2014-09-11 NOTE — Patient Instructions (Signed)
We are sending you for a stat CT scan of your abdomen.   If you have evidence of appendicitis or other surgical concerns you may be sent to the emergency room.  If you have not no major concerns on the CT scan, we will treat you for constipation. Take MiraLAX one dose daily until stools are loose, then decrease to every 2-3 days until bowels are more regular. Drink lots of fluids. Avoid alcohol. Avoid excessive cheese and bananas.  Return in about 10-14 days for a recheck. If you have heavier bleeding or persistent bleeding come in sooner. If you get acutely worse go to the emergency room.

## 2015-04-12 ENCOUNTER — Emergency Department (HOSPITAL_COMMUNITY)
Admission: EM | Admit: 2015-04-12 | Discharge: 2015-04-12 | Disposition: A | Discharge status: Home / Self Care | Payer: Self-pay | Attending: Emergency Medicine | Admitting: Emergency Medicine

## 2015-04-12 ENCOUNTER — Emergency Department (HOSPITAL_COMMUNITY): Payer: Self-pay

## 2015-04-12 ENCOUNTER — Encounter (HOSPITAL_COMMUNITY): Payer: Self-pay | Admitting: *Deleted

## 2015-04-12 DIAGNOSIS — Z79899 Other long term (current) drug therapy: Secondary | ICD-10-CM | POA: Insufficient documentation

## 2015-04-12 DIAGNOSIS — S93402A Sprain of unspecified ligament of left ankle, initial encounter: Secondary | ICD-10-CM | POA: Insufficient documentation

## 2015-04-12 DIAGNOSIS — Y9289 Other specified places as the place of occurrence of the external cause: Secondary | ICD-10-CM | POA: Insufficient documentation

## 2015-04-12 DIAGNOSIS — Z8742 Personal history of other diseases of the female genital tract: Secondary | ICD-10-CM | POA: Insufficient documentation

## 2015-04-12 DIAGNOSIS — W108XXA Fall (on) (from) other stairs and steps, initial encounter: Secondary | ICD-10-CM | POA: Insufficient documentation

## 2015-04-12 DIAGNOSIS — Y998 Other external cause status: Secondary | ICD-10-CM | POA: Insufficient documentation

## 2015-04-12 DIAGNOSIS — Y9389 Activity, other specified: Secondary | ICD-10-CM | POA: Insufficient documentation

## 2015-04-12 DIAGNOSIS — I10 Essential (primary) hypertension: Secondary | ICD-10-CM | POA: Insufficient documentation

## 2015-04-12 MED ORDER — IBUPROFEN 400 MG PO TABS
600.0000 mg | ORAL_TABLET | Freq: Once | ORAL | Status: AC
  Administered 2015-04-12: 600 mg via ORAL
  Filled 2015-04-12: qty 2

## 2015-04-12 NOTE — ED Provider Notes (Signed)
CSN: [id number]     Arrival date & time 04/12/15  0708 History   First MD Initiated Contact with Patient 04/12/15 [phone]     Chief Complaint  Patient presents with  . Ankle Pain   HPI   29 year old female presents today with left ankle pain. Patient reports last night she was up in 70 move when she missed 2 steps rolling her left ankle with an inversion mechanism. Patient denies history of the same, reports that she was able ambulate with some minor difficulty. Patient reports tenderness to the left lateral aspect of the ankle with some minor soft tissue swelling. Patient has not tried any over-the-counter therapies. Patient denies any pain to the lower extremity including the proximal fibula.   Past Medical History  Diagnosis Date  . Hypertension   . Endometriosis    History reviewed. No pertinent past surgical history. Family History  Problem Relation Age of Onset  . Hypertension Mother   . Hypertension Father   . Diabetes Father    History  Substance Use Topics  . Smoking status: Never Smoker   . Smokeless tobacco: Not on file  . Alcohol Use: Yes   OB History    No data available     Review of Systems  All other systems reviewed and are negative.   Allergies  Review of patient's allergies indicates no known allergies.  Home Medications   Prior to Admission medications   Medication Sig Start Date End Date Taking? Authorizing Provider  hydrochlorothiazide (HYDRODIURIL) 25 MG tablet Take 25 mg by mouth daily.    Historical Provider, MD  norethindrone (AYGESTIN) 5 MG tablet Take 10 mg by mouth daily.  05/02/14   Historical Provider, MD   BP 145/65 mmHg  Pulse 75  Temp(Src) 97.3 F (36.3 C) (Oral)  Resp 16  Ht 5\' 7"  (1.702 m)  Wt 310 lb (140.615 kg)  BMI 48.54 kg/m2  SpO2 100%  LMP 04/04/2015   Physical Exam  Constitutional: She is oriented to person, place, and time. She appears well-developed and well-nourished.  HENT:  Head: Normocephalic and atraumatic.   Eyes: Conjunctivae are normal. Pupils are equal, round, and reactive to light. Right eye exhibits no discharge. Left eye exhibits no discharge. No scleral icterus.  Neck: Normal range of motion. No JVD present. No tracheal deviation present.  Pulmonary/Chest: Effort normal. No stridor.  Musculoskeletal:  Patient has obvious soft tissue swelling to the left lateral ankle. No open wounds, reduced plantarflexion dorsiflexion. Anterior drawer negative, pain with eversion and inversion, no tenderness to the foot. Sensation grossly intact, cap refill less than 3 seconds. No pain to palpation of the proximal fibula or remaining distal extremity  Neurological: She is alert and oriented to person, place, and time. Coordination normal.  Skin: Skin is warm and dry.  Psychiatric: She has a normal mood and affect. Her behavior is normal. Judgment and thought content normal.  Nursing note and vitals reviewed.   ED Course  Procedures (including critical care time) Labs Review Labs Reviewed - No data to display  Imaging Review Dg Ankle Complete Left  04/12/2015   CLINICAL DATA:  Fall with acute left ankle injury, pain and swelling today. Initial encounter.  EXAM: LEFT ANKLE COMPLETE - 3+ VIEW  COMPARISON:  None.  FINDINGS: There is no evidence of acute fracture, subluxation or dislocation.  The joint spaces are unremarkable.  Diffuse soft tissue swelling is noted.  No focal bony lesions are identified.  IMPRESSION: Soft tissue swelling without  acute bony abnormality.   Electronically Signed   By: Margarette Canada M.D.   On: 04/12/2015 07:57     EKG Interpretation None      MDM   Final diagnoses:  Ankle sprain, left, initial encounter    Labs:   Imaging:  Consults:  Therapeutics: Ibuprofen  Discharge Meds:   Assessment/Plan: Patient presents with likely left ankle sprain. Patient is able to ambulate, plain films show no signs of fracture, ankle stable. Patient will be placed in a splint, given  rice instructions, follow-up with primary care provider in 2 weeks if symptoms do not improve, sooner if they worsen. Patient given strict return precautions, verbalizes understanding and agreement for today's plan with no further questions or concerns at the time of discharge.         Okey Regal, PA-C 04/12/15 [id number]  Evelina Bucy, MD 04/12/15 [phone]

## 2015-04-12 NOTE — ED Notes (Signed)
Pt reports while out this AM ,she missed a step and now has RT ankle pain.

## 2015-04-12 NOTE — ED Notes (Signed)
Declined W/C at D/C and was escorted to lobby by RN. 

## 2015-04-12 NOTE — Discharge Instructions (Signed)
Ankle Sprain °An ankle sprain is an injury to the strong, fibrous tissues (ligaments) that hold the bones of your ankle joint together.  °CAUSES °An ankle sprain is usually caused by a fall or by twisting your ankle. Ankle sprains most commonly occur when you step on the outer edge of your foot, and your ankle turns inward. People who participate in sports are more prone to these types of injuries.  °SYMPTOMS  °· Pain in your ankle. The pain may be present at rest or only when you are trying to stand or walk. °· Swelling. °· Bruising. Bruising may develop immediately or within 1 to 2 days after your injury. °· Difficulty standing or walking, particularly when turning corners or changing directions. °DIAGNOSIS  °Your caregiver will ask you details about your injury and perform a physical exam of your ankle to determine if you have an ankle sprain. During the physical exam, your caregiver will press on and apply pressure to specific areas of your foot and ankle. Your caregiver will try to move your ankle in certain ways. An X-ray exam may be done to be sure a bone was not broken or a ligament did not separate from one of the bones in your ankle (avulsion fracture).  °TREATMENT  °Certain types of braces can help stabilize your ankle. Your caregiver can make a recommendation for this. Your caregiver may recommend the use of medicine for pain. If your sprain is severe, your caregiver may refer you to a surgeon who helps to restore function to parts of your skeletal system (orthopedist) or a physical therapist. °HOME CARE INSTRUCTIONS  °· Apply ice to your injury for 1-2 days or as directed by your caregiver. Applying ice helps to reduce inflammation and pain. °¨ Put ice in a plastic bag. °¨ Place a towel between your skin and the bag. °¨ Leave the ice on for 15-20 minutes at a time, every 2 hours while you are awake. °· Only take over-the-counter or prescription medicines for pain, discomfort, or fever as directed by  your caregiver. °· Elevate your injured ankle above the level of your heart as much as possible for 2-3 days. °· If your caregiver recommends crutches, use them as instructed. Gradually put weight on the affected ankle. Continue to use crutches or a cane until you can walk without feeling pain in your ankle. °· If you have a plaster splint, wear the splint as directed by your caregiver. Do not rest it on anything harder than a pillow for the first 24 hours. Do not put weight on it. Do not get it wet. You may take it off to take a shower or bath. °· You may have been given an elastic bandage to wear around your ankle to provide support. If the elastic bandage is too tight (you have numbness or tingling in your foot or your foot becomes cold and blue), adjust the bandage to make it comfortable. °· If you have an air splint, you may blow more air into it or let air out to make it more comfortable. You may take your splint off at night and before taking a shower or bath. Wiggle your toes in the splint several times per day to decrease swelling. °SEEK MEDICAL CARE IF:  °· You have rapidly increasing bruising or swelling. °· Your toes feel extremely cold or you lose feeling in your foot. °· Your pain is not relieved with medicine. °SEEK IMMEDIATE MEDICAL CARE IF: °· Your toes are numb or blue. °·   You have severe pain that is increasing. MAKE SURE YOU:   Understand these instructions.  Will watch your condition.  Will get help right away if you are not doing well or get worse. Document Released: 08/29/2005 Document Revised: 05/23/2012 Document Reviewed: 09/10/2011 [facility] Patient Information 2015 [place], [place]. This information is not intended to replace advice given to you by your health care provider. Make sure you discuss any questions you have with your health care provider.  Please review attached information, please follow-up with primary care provider for further evaluation and management if symptoms  continue to persist. Please use ibuprofen or Tylenol as needed for pain.

## 2015-12-09 ENCOUNTER — Emergency Department (HOSPITAL_COMMUNITY)
Admission: EM | Admit: 2015-12-09 | Discharge: 2015-12-10 | Disposition: A | Discharge status: Home / Self Care | Payer: Self-pay | Attending: Emergency Medicine | Admitting: Emergency Medicine

## 2015-12-09 ENCOUNTER — Emergency Department (HOSPITAL_COMMUNITY): Payer: Self-pay

## 2015-12-09 ENCOUNTER — Encounter (HOSPITAL_COMMUNITY): Payer: Self-pay | Admitting: Emergency Medicine

## 2015-12-09 DIAGNOSIS — R52 Pain, unspecified: Secondary | ICD-10-CM

## 2015-12-09 DIAGNOSIS — I10 Essential (primary) hypertension: Secondary | ICD-10-CM | POA: Insufficient documentation

## 2015-12-09 DIAGNOSIS — R1900 Intra-abdominal and pelvic swelling, mass and lump, unspecified site: Secondary | ICD-10-CM

## 2015-12-09 DIAGNOSIS — Z3202 Encounter for pregnancy test, result negative: Secondary | ICD-10-CM | POA: Insufficient documentation

## 2015-12-09 DIAGNOSIS — R102 Pelvic and perineal pain: Secondary | ICD-10-CM

## 2015-12-09 DIAGNOSIS — D259 Leiomyoma of uterus, unspecified: Secondary | ICD-10-CM | POA: Insufficient documentation

## 2015-12-09 DIAGNOSIS — Z8742 Personal history of other diseases of the female genital tract: Secondary | ICD-10-CM | POA: Insufficient documentation

## 2015-12-09 LAB — CBC WITH DIFFERENTIAL/PLATELET
BASOS ABS: 0 10*3/uL (ref 0.0–0.1)
Basophils Relative: 0 %
EOS ABS: 0.4 10*3/uL (ref 0.0–0.7)
EOS PCT: 3 %
HCT: 38.2 % (ref 36.0–46.0)
Hemoglobin: 13 g/dL (ref 12.0–15.0)
LYMPHS ABS: 3.9 10*3/uL (ref 0.7–4.0)
Lymphocytes Relative: 32 %
MCH: 27.5 pg (ref 26.0–34.0)
MCHC: 34 g/dL (ref 30.0–36.0)
MCV: 80.8 fL (ref 78.0–100.0)
MONO ABS: 0.9 10*3/uL (ref 0.1–1.0)
Monocytes Relative: 7 %
NEUTROS PCT: 58 %
Neutro Abs: 7.1 10*3/uL (ref 1.7–7.7)
PLATELETS: 215 10*3/uL (ref 150–400)
RBC: 4.73 MIL/uL (ref 3.87–5.11)
RDW: 13.7 % (ref 11.5–15.5)
WBC: 12.3 10*3/uL — AB (ref 4.0–10.5)

## 2015-12-09 LAB — BASIC METABOLIC PANEL
Anion gap: 9 (ref 5–15)
BUN: 9 mg/dL (ref 6–20)
CALCIUM: 9.1 mg/dL (ref 8.9–10.3)
CHLORIDE: 106 mmol/L (ref 101–111)
CO2: 24 mmol/L (ref 22–32)
Creatinine, Ser: 0.68 mg/dL (ref 0.44–1.00)
GFR calc non Af Amer: >60 mL/min (ref >60)
Glucose, Bld: 94 mg/dL (ref 65–99)
Potassium: 3.9 mmol/L (ref 3.5–5.1)
Sodium: 139 mmol/L (ref 135–145)

## 2015-12-09 LAB — URINALYSIS, ROUTINE W REFLEX MICROSCOPIC
BILIRUBIN URINE: NEGATIVE
Glucose, UA: NEGATIVE mg/dL
Ketones, ur: NEGATIVE mg/dL
Nitrite: NEGATIVE
PH: 5.5 (ref 5.0–8.0)
Protein, ur: NEGATIVE mg/dL
Specific Gravity, Urine: 1.023 (ref 1.005–1.030)

## 2015-12-09 LAB — URINE MICROSCOPIC-ADD ON

## 2015-12-09 LAB — POC URINE PREG, ED: Preg Test, Ur: NEGATIVE

## 2015-12-09 MED ORDER — MORPHINE SULFATE (PF) 4 MG/ML IV SOLN: 4.0000 mg | Freq: Once | INTRAVENOUS | Status: DC

## 2015-12-09 MED ORDER — SODIUM CHLORIDE 0.9 % IV BOLUS (SEPSIS)
500.0000 mL | Freq: Once | INTRAVENOUS | Status: AC
  Administered 2015-12-09: 500 mL via INTRAVENOUS

## 2015-12-09 MED ORDER — HYDROCHLOROTHIAZIDE 25 MG PO TABS
25.0000 mg | ORAL_TABLET | Freq: Once | ORAL | Status: AC
  Administered 2015-12-09: 25 mg via ORAL
  Filled 2015-12-09: qty 1

## 2015-12-09 MED ORDER — ONDANSETRON HCL 4 MG/2ML IJ SOLN
4.0000 mg | Freq: Once | INTRAMUSCULAR | Status: AC
  Administered 2015-12-09: 4 mg via INTRAVENOUS
  Filled 2015-12-09: qty 2

## 2015-12-09 MED ORDER — MORPHINE SULFATE (PF) 4 MG/ML IV SOLN
4.0000 mg | Freq: Once | INTRAVENOUS | Status: AC
  Administered 2015-12-09: 4 mg via INTRAVENOUS
  Filled 2015-12-09: qty 1

## 2015-12-09 NOTE — ED Notes (Signed)
Pt sts left sided lower abd pain x 3 days from endometriosis per pt

## 2015-12-09 NOTE — ED Provider Notes (Signed)
CSN: [id number]     Arrival date & time 12/09/15  1527 History   First MD Initiated Contact with Patient 12/09/15 2015     Chief Complaint  Patient presents with  . Abdominal Pain     (Consider location/radiation/quality/duration/timing/severity/associated sxs/prior Treatment) HPI   Cathy Bryan is a 30 y/o female, with hx of endometriosis and HTN, presents to the ER with three days of severe 9/10, sharp and cramping LLQ pain, with radiation to her back, lasting up to two hours at a time, mildly and temporily improved with rest and NSAIDS, consistent with cramping with her periods, but more severe, and worsening over the past several months.   Pt was dx with endometriosis in 2016, was told her left fallopian tube was swollen and ovaries had multiple cysts up to 9 cm.  She was treated with progesterone and improved, however when she turned 30 she lost her insurance and was unable to refill and afford medications.  LMP began 2 days ago.  She denies possibility of pregnancy, is monogomous with single female partner.  Last sex was 2 weeks ago, extremely painful.  No vaginal discharge or irritation.  No urinary sx.  Pt has increased frequency of BM's over the past couple days, no melena, no hematochezia.  Pt denies N, V, fever, chills, sweats, diarrhea, chest pain, shortness of breath, weakness, and fatigue, pallor.  LMP 12/09/15     Past Medical History  Diagnosis Date  . Hypertension   . Endometriosis    History reviewed. No pertinent past surgical history. Family History  Problem Relation Age of Onset  . Hypertension Mother   . Hypertension Father   . Diabetes Father    Social History  Substance Use Topics  . Smoking status: Never Smoker   . Smokeless tobacco: None  . Alcohol Use: Yes   OB History    No data available     Review of Systems  Constitutional: Negative.  Negative for fever, chills, diaphoresis, activity change, appetite change and fatigue.  HENT: Negative.     Eyes: Negative.   Respiratory: Negative.  Negative for shortness of breath.   Cardiovascular: Negative.  Negative for chest pain.  Gastrointestinal: Positive for abdominal pain. Negative for nausea, vomiting, diarrhea, constipation and abdominal distention.  Endocrine: Negative.   Genitourinary: Positive for vaginal pain, menstrual problem, pelvic pain and dyspareunia. Negative for dysuria, urgency, hematuria, decreased urine volume, vaginal bleeding, vaginal discharge and difficulty urinating.  Musculoskeletal: Negative.   Skin: Negative.  Negative for color change, pallor and rash.  Neurological: Negative.  Negative for dizziness, weakness, light-headedness and headaches.  Hematological: Negative.   Psychiatric/Behavioral: Negative.   All other systems reviewed and are negative.     Allergies  Review of patient's allergies indicates no known allergies.  Home Medications   Prior to Admission medications   Medication Sig Start Date End Date Taking? Authorizing Provider  ibuprofen (ADVIL,MOTRIN) 100 MG tablet Take 100 mg by mouth every 6 (six) hours as needed for fever.   Yes Historical Provider, MD  amLODipine (NORVASC) 5 MG tablet Take 1 tablet (5 mg total) by mouth at bedtime. 12/10/15   Delsa Grana, PA-C  hydrochlorothiazide (HYDRODIURIL) 25 MG tablet Take 1 tablet (25 mg total) by mouth daily. 12/10/15   Delsa Grana, PA-C  HYDROcodone-acetaminophen (NORCO/VICODIN) 5-325 MG tablet Take 1-2 tablets by mouth every 6 (six) hours as needed for severe pain. 12/10/15   Delsa Grana, PA-C   BP 113/67 mmHg  Pulse 86  Temp(Src)  98.2 F (36.8 C) (Oral)  Resp 16  SpO2 98% Physical Exam  Constitutional: She is oriented to person, place, and time. She appears well-developed and well-nourished. No distress.  HENT:  Head: Normocephalic and atraumatic.  Nose: Nose normal.  Mouth/Throat: Oropharynx is clear and moist. No oropharyngeal exudate.  Eyes: Conjunctivae and EOM are normal. Pupils  are equal, round, and reactive to light. Right eye exhibits no discharge. Left eye exhibits no discharge. No scleral icterus.  Neck: Normal range of motion. No JVD present. No tracheal deviation present. No thyromegaly present.  Cardiovascular: Normal rate, regular rhythm, normal heart sounds and intact distal pulses.  Exam reveals no gallop and no friction rub.   No murmur heard. Pulmonary/Chest: Effort normal and breath sounds normal. No respiratory distress. She has no wheezes. She has no rales. She exhibits no tenderness.  Abdominal: Soft. Normal appearance and bowel sounds are normal. She exhibits no distension and no mass. There is tenderness. There is guarding. There is no rebound and no CVA tenderness.    Abdomen obese, soft   Musculoskeletal: Normal range of motion. She exhibits no edema or tenderness.  Lymphadenopathy:    She has no cervical adenopathy.  Neurological: She is alert and oriented to person, place, and time. She has normal reflexes. No cranial nerve deficit. She exhibits normal muscle tone. Coordination normal.  Skin: Skin is warm and dry. No rash noted. She is not diaphoretic. No erythema. No pallor.  Psychiatric: She has a normal mood and affect. Her behavior is normal. Judgment and thought content normal.  Nursing note and vitals reviewed.   ED Course  Procedures (including critical care time) Labs Review Labs Reviewed  URINALYSIS, ROUTINE W REFLEX MICROSCOPIC (NOT AT [facility]) - Abnormal; Notable for the following:    APPearance HAZY (*)    Hgb urine dipstick LARGE (*)    Leukocytes, UA SMALL (*)    All other components within normal limits  URINE MICROSCOPIC-ADD ON - Abnormal; Notable for the following:    Squamous Epithelial / LPF 6-30 (*)    Bacteria, UA MANY (*)    All other components within normal limits  CBC WITH DIFFERENTIAL/PLATELET - Abnormal; Notable for the following:    WBC 12.3 (*)    All other components within normal limits  BASIC METABOLIC  PANEL  POC URINE PREG, ED    Imaging Review No results found.   CT Abdomen Pelvis W Contrast   02/26/2014  [facility]  Result Narrative  [id number]   Attending MD:  Tawanna Solo [id number]   Ordering MD:  Tawanna Solo Date of Birth:  [birth year]    Sex: F Admit Date:  02/26/2014 13:32    ###FINAL RESULT###      INDICATIONS: RLQ PAIN-MILD REBOUND TENDERNESS COMMENTS:       PROCEDURE:  QCT [id number] CT ABD/PELVIS W/CONT - Feb 26 2014    [employer] Accession #: [id number] [employer] Accession #: [id number]     TECHNIQUE: Axial CT images were obtained through the abdomen and pelvis  with 100 cc of Isovue-370 intravenous contrast.  Comparison: 01/03/2014  FINDINGS:The complex cystic mass located superior to the uterine fundus  is again noted and unchanged in appearance measuring about 9 x 5 cm. The  appendix is filled with oral contrast and is not enlarged as it descends  down to the anterior border of this mass. There are no inflammatory  changes of the appendix.  The liver, gallbladder, pancreas, spleen, kidneys, and adrenal glands are  unremarkable.  No free fluid. No adenopathy.      IMPRESSION: Complex cystic mass in the pelvis likely involves both  ovaries and is relatively stable compared to the previous CT. The  appendix appears normal.         ___________________________________________________________ Current Report Read by:  Zoila Shutter on Feb 26 2014  3:55P Transcribed byJari Pigg   on Feb 26 2014  4:01P Electronically Signed by:  DR. Jeannette How on:  Feb 26 2014  4:01P      I have personally reviewed and evaluated these images and lab results as part of my medical decision-making.   EKG Interpretation None      MDM   Pt with pelvic pain  10:50 PM Pelvic ultrasound was significant for a cystic mass, appears enlarged from prior scans available and care everywhere. Radiologist suggests follow-up pelvic MRI w/wo contrast.  Findings were  reviewed by attending physician, Dr. Alvino Chapel. He suggests consultation with OB/GYN on call, Dr.[name], who recommends obtaining MRI tonight and she will secure follow-up in the clinic.  She suggests pain medications at the time of discharge and no other treatment  MRI obtained, results will not be available until tomorrow.  Pain treated in the ER, pt d/c with refills of her BP meds and pain medication.  I discussed the results of the [place] with the pt, including the increasing size of the mass.  I also updated pt that results of MRI would be reviewed with her at the [facility] office.    She was discharged in good condition, declined pain meds before d/c VSS Filed Vitals:   12/09/15 1758 12/09/15 2221 12/09/15 2230 12/10/15 0107  BP: 157/95 170/95 159/96 113/67  Pulse: 79 84 72 86  Temp:      TempSrc:      Resp: 18 16 16    SpO2: 100% 98% 100% 98%      Final diagnoses:  Pelvic mass in female  Pelvic pain in female  Uterine leiomyoma, unspecified location       Delsa Grana, PA-C 12/14/15 [place], MD 12/15/15 [phone]

## 2015-12-09 NOTE — ED Notes (Signed)
Patient transported to MRI 

## 2015-12-10 MED ORDER — AMLODIPINE BESYLATE 5 MG PO TABS: 5.0000 mg | ORAL_TABLET | Freq: Every day | ORAL | Status: DC

## 2015-12-10 MED ORDER — GADOBENATE DIMEGLUMINE 529 MG/ML IV SOLN
20.0000 mL | Freq: Once | INTRAVENOUS | Status: AC | PRN
  Administered 2015-12-10: 20 mL via INTRAVENOUS

## 2015-12-10 MED ORDER — HYDROCHLOROTHIAZIDE 25 MG PO TABS: 25.0000 mg | ORAL_TABLET | Freq: Every day | ORAL | Status: DC

## 2015-12-10 MED ORDER — AMLODIPINE BESYLATE 5 MG PO TABS: 5.0000 mg | ORAL_TABLET | Freq: Once | ORAL | Status: DC

## 2015-12-10 MED ORDER — OXYCODONE-ACETAMINOPHEN 5-325 MG PO TABS: 1.0000 | ORAL_TABLET | Freq: Once | ORAL | Status: DC

## 2015-12-10 MED ORDER — HYDROCODONE-ACETAMINOPHEN 5-325 MG PO TABS: 1.0000 | ORAL_TABLET | Freq: Four times a day (QID) | ORAL | Status: DC | PRN

## 2015-12-10 NOTE — Discharge Instructions (Signed)
Pelvic Pain, Female Female pelvic pain can be caused by many different things and start from a variety of places. Pelvic pain refers to pain that is located in the lower half of the abdomen and between your hips. The pain may occur over a short period of time (acute) or may be reoccurring (chronic). The cause of pelvic pain may be related to disorders affecting the female reproductive organs (gynecologic), but it may also be related to the bladder, kidney stones, an intestinal complication, or muscle or skeletal problems. Getting help right away for pelvic pain is important, especially if there has been severe, sharp, or a sudden onset of unusual pain. It is also important to get help right away because some types of pelvic pain can be life threatening.  CAUSES  Below are only some of the causes of pelvic pain. The causes of pelvic pain can be in one of several categories.   Gynecologic.  Pelvic inflammatory disease.  Sexually transmitted infection.  Ovarian cyst or a twisted ovarian ligament (ovarian torsion).  Uterine lining that grows outside the uterus (endometriosis).  Fibroids, cysts, or tumors.  Ovulation.  Pregnancy.  Pregnancy that occurs outside the uterus (ectopic pregnancy).  Miscarriage.  Labor.  Abruption of the placenta or ruptured uterus.  Infection.  Uterine infection (endometritis).  Bladder infection.  Diverticulitis.  Miscarriage related to a uterine infection (septic abortion).  Bladder.  Inflammation of the bladder (cystitis).  Kidney stone(s).  Gastrointestinal.  Constipation.  Diverticulitis.  Neurologic.  Trauma.  Feeling pelvic pain because of mental or emotional causes (psychosomatic).  Cancers of the bowel or pelvis. EVALUATION  Your caregiver will want to take a careful history of your concerns. This includes recent changes in your health, a careful gynecologic history of your periods (menses), and a sexual history. Obtaining  your family history and medical history is also important. Your caregiver may suggest a pelvic exam. A pelvic exam will help identify the location and severity of the pain. It also helps in the evaluation of which organ system may be involved. In order to identify the cause of the pelvic pain and be properly treated, your caregiver may order tests. These tests may include:   A pregnancy test.  Pelvic ultrasonography.  An X-ray exam of the abdomen.  A urinalysis or evaluation of vaginal discharge.  Blood tests. HOME CARE INSTRUCTIONS   Only take over-the-counter or prescription medicines for pain, discomfort, or fever as directed by your caregiver.   Rest as directed by your caregiver.   Eat a balanced diet.   Drink enough fluids to make your urine clear or pale yellow, or as directed.   Avoid sexual intercourse if it causes pain.   Apply warm or cold compresses to the lower abdomen depending on which one helps the pain.   Avoid stressful situations.   Keep a journal of your pelvic pain. Write down when it started, where the pain is located, and if there are things that seem to be associated with the pain, such as food or your menstrual cycle.  Follow up with your caregiver as directed.  SEEK MEDICAL CARE IF:  Your medicine does not help your pain.  You have abnormal vaginal discharge. SEEK IMMEDIATE MEDICAL CARE IF:   You have heavy bleeding from the vagina.   Your pelvic pain increases.   You feel light-headed or faint.   You have chills.   You have pain with urination or blood in your urine.   You have uncontrolled  diarrhea or vomiting.   You have a fever or persistent symptoms for more than 3 days.  You have a fever and your symptoms suddenly get worse.   You are being physically or sexually abused.   This information is not intended to replace advice given to you by your health care provider. Make sure you discuss any questions you have with  your health care provider.   Document Released: 07/26/2004 Document Revised: 05/20/2015 Document Reviewed: 12/19/2011 Elsevier Interactive Patient Education 2016 [employer]  Pelvic Mass A pelvic mass is an abnormal growth in the pelvis. The pelvis is the area between your hip bones. It includes the bladder and the rectum in males and females, and also the uterus and ovaries in females. CAUSES Many things can cause a pelvic mass, including:  Cancer.  Fibroids of the uterus.  Ovarian cysts.  Infection.  Ectopic pregnancy. SIGNS AND SYMPTOMS Symptoms of a pelvic mass may include:  Cramping.  Nausea.  Diarrhea.  Fever.  Vomiting.  Weakness.  Pain in the pelvis, side, or back.  Weight loss.  Constipation.  Problems with vaginal bleeding, including:  Light or heavy bleeding with or without blood clots.  Irregular menstruation.  Pain with menstruation.  Problems with urination, including:  Frequent urination.  Inability to empty the bladder completely.  Urinating very small amounts.  Pain with urination.  Bloody urine. Some pelvic masses do not cause symptoms. DIAGNOSIS To make a diagnosis, your health care provider will need to learn more about the mass. You may have tests or procedures done, such as:  Blood tests.  X-rays.  Ultrasound.  CT scan.  MRI.  A surgery to look inside of your abdomen with cameras (laparoscopy).  A biopsy that is performed with a needle or during laparoscopy or surgery. In some cases, what seemed like a pelvic mass may actually be something else, such as a mass in one of the organs that are near the pelvis, an infection (abscess) or scar tissue (adhesions) that formed after a surgery. TREATMENT Treatment will depend on the cause of the mass. HOME CARE INSTRUCTIONS What you need to do at home will depend on the cause of the mass. Follow the instructions that your health care provider gives to you. In  general:  Keep all follow-up visits as directed by your health care provider. This is important.  Take medicines only as directed by your health care provider.  Follow any restrictions that are given to you by your health care provider. SEEK MEDICAL CARE IF:  You develop new symptoms. SEEK IMMEDIATE MEDICAL CARE IF:  You vomit bright red blood or vomit material that looks like coffee grounds.  You have blood in your stools, or the stools turn black and tarry.  You have an abnormal or increased amount of vaginal bleeding.  You have a fever.  You develop easy bruising or bleeding.  You develop sudden or worsening pain that is not controlled by your medicine.  You feel worsening weakness, or you have a fainting episode.  You feel that the mass has suddenly gotten larger.  You develop severe bloating in your abdomen or your pelvis.  You cannot pass any urine.  You are unable to have a bowel movement.   This information is not intended to replace advice given to you by your health care provider. Make sure you discuss any questions you have with your health care provider.   Document Released: 12/06/2006 Document Revised: 09/19/2014 Document Reviewed: 04/14/2014 Elsevier Interactive  Patient Education 2016 [employer].  Uterine Fibroids Uterine fibroids are tissue masses (tumors) that can develop in the womb (uterus). They are also called leiomyomas. This type of tumor is not cancerous (benign) and does not spread to other parts of the body outside of the pelvic area, which is between the hip bones. Occasionally, fibroids may develop in the fallopian tubes, in the cervix, or on the support structures (ligaments) that surround the uterus. You can have one or many fibroids. Fibroids can vary in size, weight, and where they grow in the uterus. Some can become quite large. Most fibroids do not require medical treatment. CAUSES A fibroid can develop when a single uterine cell keeps  growing (replicating). Most cells in the human body have a control mechanism that keeps them from replicating without control. SIGNS AND SYMPTOMS Symptoms may include:   Heavy bleeding during your period.  Bleeding or spotting between periods.  Pelvic pain and pressure.  Bladder problems, such as needing to urinate more often (urinary frequency) or urgently.  Inability to reproduce offspring (infertility).  Miscarriages. DIAGNOSIS Uterine fibroids are diagnosed through a physical exam. Your health care provider may feel the lumpy tumors during a pelvic exam. Ultrasonography and an MRI may be done to determine the size, location, and number of fibroids. TREATMENT Treatment may include:  Watchful waiting. This involves getting the fibroid checked by your health care provider to see if it grows or shrinks. Follow your health care provider's recommendations for how often to have this checked.  Hormone medicines. These can be taken by mouth or given through an intrauterine device (IUD).  Surgery.  Removing the fibroids (myomectomy) or the uterus (hysterectomy).  Removing blood supply to the fibroids (uterine artery embolization). If fibroids interfere with your fertility and you want to become pregnant, your health care provider may recommend having the fibroids removed.  HOME CARE INSTRUCTIONS  Keep all follow-up visits as directed by your health care provider. This is important.  Take medicines only as directed by your health care provider.  If you were prescribed a hormone treatment, take the hormone medicines exactly as directed.  Do not take aspirin, because it can cause bleeding.  Ask your health care provider about taking iron pills and increasing the amount of dark green, leafy vegetables in your diet. These actions can help to boost your blood iron levels, which may be affected by heavy menstrual bleeding.  Pay close attention to your period and tell your health care  provider about any changes, such as:  Increased blood flow that requires you to use more pads or tampons than usual per month.  A change in the number of days that your period lasts per month.  A change in symptoms that are associated with your period, such as abdominal cramping or back pain. SEEK MEDICAL CARE IF:  You have pelvic pain, back pain, or abdominal cramps that cannot be controlled with medicines.  You have an increase in bleeding between and during periods.  You soak tampons or pads in a half hour or less.  You feel lightheaded, extra tired, or weak. SEEK IMMEDIATE MEDICAL CARE IF:  You faint.  You have a sudden increase in pelvic pain.   This information is not intended to replace advice given to you by your health care provider. Make sure you discuss any questions you have with your health care provider.   Document Released: 08/26/2000 Document Revised: 09/19/2014 Document Reviewed: 02/25/2014 Elsevier Interactive Patient Education [employer].

## 2015-12-15 ENCOUNTER — Ambulatory Visit: Payer: Self-pay | Attending: Family Medicine | Admitting: Family Medicine

## 2015-12-15 ENCOUNTER — Encounter: Payer: Self-pay | Admitting: Family Medicine

## 2015-12-15 VITALS — BP 129/85 | HR 93 | Temp 98.3°F | Resp 15 | Ht 67.0 in | Wt 309.4 lb

## 2015-12-15 DIAGNOSIS — Z79899 Other long term (current) drug therapy: Secondary | ICD-10-CM | POA: Insufficient documentation

## 2015-12-15 DIAGNOSIS — I10 Essential (primary) hypertension: Secondary | ICD-10-CM | POA: Insufficient documentation

## 2015-12-15 DIAGNOSIS — N809 Endometriosis, unspecified: Secondary | ICD-10-CM | POA: Insufficient documentation

## 2015-12-15 MED ORDER — HYDROCHLOROTHIAZIDE 25 MG PO TABS: 25.0000 mg | ORAL_TABLET | Freq: Every day | ORAL | Status: DC

## 2015-12-15 NOTE — Progress Notes (Signed)
Follow up for chronic abdominal pain No pain today Needs orange card

## 2015-12-15 NOTE — Progress Notes (Signed)
Subjective:  Patient ID: Cathy Bryan, female    DOB: [birth year]  Age: 30 y.o. MRN: [id number]  CC: Follow-up   HPI Cathy Bryan is a 30 year old female with a history of hypertension, endometriosis who comes into the clinic to establish care. Previously followed by Dr. Cipriano Mile until she lost her insurance.  She had presented to the ED a couple of days ago with abdominal pain; transvaginal and pelvic ultrasound as well as MRI of the pelvis performed which were in keeping with 11 cm complex cystic mass in the central pelvis superior to the uterus involving both adnexa, characteristics favor a large endometrioma, cystic ovarian neoplasm is considered less likely. Small uterine fibroids measuring up to 3.5 cm.  She is scheduled to see GYN tomorrow and at this time denies any abdominal pain, nausea or vomiting. She has a known history of endometriosis and has been on prednisone from [place]; her cycles very from 28-36 days with a LMP of 12/09/15 and periods last for about 3-4 days.  Outpatient Prescriptions Prior to Visit  Medication Sig Dispense Refill  . ibuprofen (ADVIL,MOTRIN) 100 MG tablet Take 100 mg by mouth every 6 (six) hours as needed for fever.    . hydrochlorothiazide (HYDRODIURIL) 25 MG tablet Take 1 tablet (25 mg total) by mouth daily. 30 tablet 1  . HYDROcodone-acetaminophen (NORCO/VICODIN) 5-325 MG tablet Take 1-2 tablets by mouth every 6 (six) hours as needed for severe pain. (Patient not taking: Reported on 12/15/2015) 20 tablet 0  . amLODipine (NORVASC) 5 MG tablet Take 1 tablet (5 mg total) by mouth at bedtime. (Patient not taking: Reported on 12/15/2015) 30 tablet 1   No facility-administered medications prior to visit.    ROS Review of Systems  Constitutional: Negative for activity change and appetite change.  HENT: Negative for sinus pressure and sore throat.   Respiratory: Negative for chest tightness, shortness of breath and wheezing.   Cardiovascular: Negative for chest  pain and palpitations.  Gastrointestinal: Negative for abdominal pain, constipation and abdominal distention.  Genitourinary: Negative.   Musculoskeletal: Negative.   Psychiatric/Behavioral: Negative for behavioral problems and dysphoric mood.    Objective:  BP 129/85 mmHg  Pulse 93  Temp(Src) 98.3 F (36.8 C)  Resp 15  Ht 5\' 7"  (1.702 m)  Wt 309 lb 6.4 oz (140.343 kg)  BMI 48.45 kg/m2  SpO2 95%  BP/Weight 12/15/2015 12/10/2015 [id number]  Systolic BP Q000111Q [id number] Q000111Q  Diastolic BP 85 67 65  Wt. (Lbs) 309.4 - 310  BMI 48.45 - 48.54      Physical Exam  Constitutional: She is oriented to person, place, and time. She appears well-developed and well-nourished.  Morbidly obese  Cardiovascular: Normal rate, normal heart sounds and intact distal pulses.   No murmur heard. Pulmonary/Chest: Effort normal and breath sounds normal. She has no wheezes. She has no rales. She exhibits no tenderness.  Abdominal: Soft. Bowel sounds are normal. She exhibits no distension and no mass. There is no tenderness.  Musculoskeletal: Normal range of motion.  Neurological: She is alert and oriented to person, place, and time.   CLINICAL DATA: Pelvic pain. Large complex adnexal mass seen on recent ultrasound. Fibroids.  EXAM: MRI PELVIS WITHOUT AND WITH CONTRAST  TECHNIQUE: Multiplanar multisequence MR imaging of the pelvis was performed both before and after administration of intravenous contrast.  CONTRAST: 12mL MULTIHANCE GADOBENATE DIMEGLUMINE 529 MG/ML IV SOLN  COMPARISON: Pelvic ultrasound on 12/09/2015  FINDINGS: Urinary Tract: Bladder appears normal.  Bowel: Unremarkable visualized pelvic bowel  loops.  Vascular/Lymphatic: No pathologically enlarged lymph nodes. No significant vascular abnormality seen.  Uterus: Measures 7.6 x 5.7 x 6.3 cm. Several small intramural fibroids are seen which range in size from less than 1 cm the largest in the right anterior corpus measuring  3.5 cm and showing lack of contrast enhancement due to internal degeneration.  Endometrium: Unremarkable.  Cervix/Vagina: Appears normal.  Right ovary: Not well visualized.  Left ovary: Not well visualized.  Other: A large complex cystic mass is seen within the central pelvis superior to the uterus which involves both adnexal regions. This measures 7.3 x 11.0 by 9.1 cm. This lesion shows thickened septations, with multiple cystic components demonstrating T1 hyperintensity and low T2 signal intensity shading. No internal fat is seen. These findings favor a large endometrioma. Mucinous cystic ovarian neoplasm is considered less likely but cannot definitely be excluded. No evidence of ascites.  Musculoskeletal: Unremarkable.  IMPRESSION: 11 cm complex cystic mass in the central pelvis superior to the uterus, which involves both adnexal regions. MR characteristics favor a large endometrioma. Mucinous cystic ovarian neoplasm is considered less likely, but cannot definitely be excluded. Surgical evaluation should be considered.  Small uterine fibroids measuring up to 3.5 cm.   Electronically Signed  By: Earle Gell M.D.  On: 12/10/2015 08:57   Assessment & Plan:   1. Essential hypertension Controlled Med list reveal she is also on amlodipine which she denies ever picking up; I have discontinued amlodipine as blood pressure is controlled on just hydrochlorothiazide Fasting labs at next office visit - hydrochlorothiazide (HYDRODIURIL) 25 MG tablet; Take 1 tablet (25 mg total) by mouth daily.  Dispense: 30 tablet; Refill: 2  2. Endometriosis Asymptomatic at this time Encouraged to keep appointment with GYN especially in the setting of MRI findings consistent with a pelvic mass   Meds ordered this encounter  Medications  . hydrochlorothiazide (HYDRODIURIL) 25 MG tablet    Sig: Take 1 tablet (25 mg total) by mouth daily.    Dispense:  30 tablet    Refill:   2    Follow-up: Return in about 1 month (around 01/14/2016) for Follow-up on hypertension and fasting labs.   Arnoldo Morale MD

## 2015-12-15 NOTE — Patient Instructions (Signed)
Endometriosis Endometriosis is a condition in which the tissue that lines the uterus (endometrium) grows outside of its normal location. The tissue may grow in many locations close to the uterus, but it commonly grows on the ovaries, fallopian tubes, vagina, or bowel. Because the uterus expels, or sheds, its lining every menstrual cycle, there is bleeding wherever the endometrial tissue is located. This can cause pain because blood is irritating to tissues not normally exposed to it.  CAUSES  The cause of endometriosis is not known.  SIGNS AND SYMPTOMS  Often, there are no symptoms. When symptoms are present, they can vary with the location of the displaced tissue. Various symptoms can occur at different times. Although symptoms occur mainly during a woman's menstrual period, they can also occur midcycle and usually stop with menopause. Some people may go months with no symptoms at all. Symptoms may include:   Back or abdominal pain.   Heavier bleeding during periods.   Pain during intercourse.   Painful bowel movements.   Infertility. DIAGNOSIS  Your health care provider will do a physical exam and ask about your symptoms. Various tests may be done, such as:   Blood tests and urine tests. These are done to help rule out other problems.   Ultrasound. This test is done to look for abnormal tissue.   An X-ray of the lower bowel (barium enema).  Laparoscopy. In this procedure, a thin, lighted tube with a tiny camera on the end (laparoscope) is inserted into your abdomen. This helps your health care provider look for abnormal tissue to confirm the diagnosis. The health care provider may also remove a small piece of tissue (biopsy) from any abnormal tissue found. This tissue sample can then be sent to a lab so it can be looked at under a microscope. TREATMENT  Treatment will vary and may include:   Medicines to relieve pain. Nonsteroidal anti-inflammatory drugs (NSAIDs) are a type of  pain medicine that can help to relieve the pain caused by endometriosis.  Hormonal therapy. When using hormonal therapy, periods are eliminated. This eliminates the monthly exposure to blood by the displaced endometrial tissue.   Surgery. Surgery may sometimes be done to remove the abnormal endometrial tissue. In severe cases, surgery may be done to remove the fallopian tubes, uterus, and ovaries (hysterectomy). HOME CARE INSTRUCTIONS   Take all medicines as directed by your health care provider. Do not take aspirin because it may increase bleeding when you are not on hormonal therapy.   Avoid activities that produce pain, including sexual activity. SEEK MEDICAL CARE IF:  You have pelvic pain before, after, or during your periods.  You have pelvic pain between periods that gets worse during your period.  You have pelvic pain during or after sex.  You have pelvic pain with bowel movements or urination, especially during your period.  You have problems getting pregnant.  You have a fever. SEEK IMMEDIATE MEDICAL CARE IF:   Your pain is severe and is not responding to pain medicine.   You have severe nausea and vomiting, or you cannot keep foods down.   You have pain that is limited to the right lower part of your abdomen.   You have swelling or increasing pain in your abdomen.   You see blood in your stool.  MAKE SURE YOU:   Understand these instructions.  Will watch your condition.  Will get help right away if you are not doing well or get worse.   This information   is not intended to replace advice given to you by your health care provider. Make sure you discuss any questions you have with your health care provider.   Document Released: 08/26/2000 Document Revised: 09/19/2014 Document Reviewed: 04/26/2013 Elsevier Interactive Patient Education 2016 [employer]  

## 2015-12-16 ENCOUNTER — Encounter: Payer: Self-pay | Admitting: Obstetrics & Gynecology

## 2015-12-16 ENCOUNTER — Ambulatory Visit (INDEPENDENT_AMBULATORY_CARE_PROVIDER_SITE_OTHER): Payer: Self-pay | Admitting: Obstetrics & Gynecology

## 2015-12-16 VITALS — BP 150/92 | HR 97 | Temp 97.7°F | Ht 67.0 in | Wt 308.0 lb

## 2015-12-16 DIAGNOSIS — N809 Endometriosis, unspecified: Secondary | ICD-10-CM

## 2015-12-16 DIAGNOSIS — D259 Leiomyoma of uterus, unspecified: Secondary | ICD-10-CM

## 2015-12-16 DIAGNOSIS — R1900 Intra-abdominal and pelvic swelling, mass and lump, unspecified site: Secondary | ICD-10-CM

## 2015-12-16 NOTE — Patient Instructions (Signed)
Ovarian Cystectomy Ovarian cystectomy is surgery to remove a fluid-filled sac (cyst) on an ovary. The ovaries are small organs that produce eggs in women. Various types of cysts can form on the ovaries. Most are not cancerous. Surgery may be done if a cyst is large or is causing symptoms such as pain. It may also be done for a cyst that is or might be cancerous. This surgery can be done using a laparoscopic technique or an open abdominal technique. The laparoscopic technique involves smaller cuts (incisions) and a faster recovery time. The technique used will depend on your age, the type of cyst, and whether the cyst is cancerous. The laparoscopic technique is not used for a cancerous cyst. LET YOUR HEALTH CARE PROVIDER KNOW ABOUT:   Any allergies you have.  All medicines you are taking, including vitamins, herbs, eye drops, creams, and over-the-counter medicines.  Previous problems you or members of your family have had with the use of anesthetics.  Any blood disorders you have.  Previous surgeries you have had.  Medical conditions you have.  Any chance you might be pregnant. RISKS AND COMPLICATIONS Generally, this is a safe procedure. However, as with any procedure, complications can occur. Possible complications include:  Excessive bleeding.  Infection.  Injury to other organs.  Blood clots.  Becoming incapable of getting pregnant (infertile). BEFORE THE PROCEDURE  Ask your health care provider about changing or stopping any regular medicines. Avoid taking aspirin, ibuprofen, or blood thinners as directed by your health care provider.  Do not eat or drink anything after midnight the night before surgery.  If you smoke, do not smoke for at least 2 weeks before your surgery.  Do not drink alcohol the day before your surgery.  Let your health care provider know if you develop a cold or any infection before your surgery.  Arrange for someone to drive you home after the  procedure or after your hospital stay. Also arrange for someone to help you with activities during recovery. PROCEDURE  Either a laparoscopic technique or an open abdominal technique may be used for this surgery.  Small monitors will be put on your body. They are used to check your heart, blood pressure, and oxygen level.   An IV access tube will be put into one of your veins. Medicine will be able to flow directly into your body through this IV tube.   You might be given a medicine to help you relax (sedative).   You will be given a medicine to make you sleep (general anesthetic). A breathing tube may be placed into your lungs during the procedure. Laparoscopic Technique  Several small cuts (incisions) are made in your abdomen. These are typically about 1 to 2 cm long.   Your abdomen will be filled with carbon dioxide gas so that it expands. This gives the surgeon more room to operate and makes your organs easier to see.   A thin, lighted tube with a tiny camera on the end (laparoscope) is put through one of the small incisions. The camera on the laparoscope sends a picture to a TV screen in the operating room. This gives the surgeon a good view inside your abdomen.   Hollow tubes are put through the other small incisions in your abdomen. The tools needed for the procedure are put through these tubes.  The ovary with the cyst is identified, and the cyst is removed. It is sent to the lab for testing. If it is cancer, both ovaries   may need to be removed during a different surgery.  Tools are removed. The incisions are then closed with stitches or skin glue, and dressings may be applied. Open Abdominal Technique  A single large incision is made along your bikini line or in the middle of your lower abdomen.  The ovary with the cyst is identified, and the cyst is removed. It is sent to the lab for testing. If it is cancer, both ovaries may need to be removed during a different  surgery.  The incision is then closed with stitches or staples. AFTER THE PROCEDURE   You will wake up from anesthesia and be taken to a recovery area.  If you had laparoscopic surgery, you may be able to go home the same day, or you may need to stay in the hospital overnight.  If you had open abdominal surgery, you will need to stay in the hospital for a few days.  Your IV access tube and catheter will be removed the first or second day, after you are able to eat and drink enough.  You may be given medicine to relieve pain or to help you sleep.  You may be given an antibiotic medicine if needed.   This information is not intended to replace advice given to you by your health care provider. Make sure you discuss any questions you have with your health care provider.   Document Released: 06/26/2007 Document Revised: 06/19/2013 Document Reviewed: 04/10/2013 Elsevier Interactive Patient Education 2016 [place]. Ovarian Cyst An ovarian cyst is a fluid-filled sac that forms on an ovary. The ovaries are small organs that produce eggs in women. Various types of cysts can form on the ovaries. Most are not cancerous. Many do not cause problems, and they often go away on their own. Some may cause symptoms and require treatment. Common types of ovarian cysts include:  Functional cysts--These cysts may occur every month during the menstrual cycle. This is normal. The cysts usually go away with the next menstrual cycle if the woman does not get pregnant. Usually, there are no symptoms with a functional cyst.  Endometrioma cysts--These cysts form from the tissue that lines the uterus. They are also called "chocolate cysts" because they become filled with blood that turns brown. This type of cyst can cause pain in the lower abdomen during intercourse and with your menstrual period.  Cystadenoma cysts--This type develops from the cells on the outside of the ovary. These cysts can get very big and  cause lower abdomen pain and pain with intercourse. This type of cyst can twist on itself, cut off its blood supply, and cause severe pain. It can also easily rupture and cause a lot of pain.  Dermoid cysts--This type of cyst is sometimes found in both ovaries. These cysts may contain different kinds of body tissue, such as skin, teeth, hair, or cartilage. They usually do not cause symptoms unless they get very big.  Theca lutein cysts--These cysts occur when too much of a certain hormone (human chorionic gonadotropin) is produced and overstimulates the ovaries to produce an egg. This is most common after procedures used to assist with the conception of a baby (in vitro fertilization). CAUSES   Fertility drugs can cause a condition in which multiple large cysts are formed on the ovaries. This is called ovarian hyperstimulation syndrome.  A condition called polycystic ovary syndrome can cause hormonal imbalances that can lead to nonfunctional ovarian cysts. SIGNS AND SYMPTOMS  Many ovarian cysts do not cause  symptoms. If symptoms are present, they may include:  Pelvic pain or pressure.  Pain in the lower abdomen.  Pain during sexual intercourse.  Increasing girth (swelling) of the abdomen.  Abnormal menstrual periods.  Increasing pain with menstrual periods.  Stopping having menstrual periods without being pregnant. DIAGNOSIS  These cysts are commonly found during a routine or annual pelvic exam. Tests may be ordered to find out more about the cyst. These tests may include:  Ultrasound.  X-ray of the pelvis.  CT scan.  MRI.  Blood tests. TREATMENT  Many ovarian cysts go away on their own without treatment. Your health care provider may want to check your cyst regularly for 2-3 months to see if it changes. For women in menopause, it is particularly important to monitor a cyst closely because of the higher rate of ovarian cancer in menopausal women. When treatment is needed, it  may include any of the following:  A procedure to drain the cyst (aspiration). This may be done using a long needle and ultrasound. It can also be done through a laparoscopic procedure. This involves using a thin, lighted tube with a tiny camera on the end (laparoscope) inserted through a small incision.  Surgery to remove the whole cyst. This may be done using laparoscopic surgery or an open surgery involving a larger incision in the lower abdomen.  Hormone treatment or birth control pills. These methods are sometimes used to help dissolve a cyst. HOME CARE INSTRUCTIONS   Only take over-the-counter or prescription medicines as directed by your health care provider.  Follow up with your health care provider as directed.  Get regular pelvic exams and Pap tests. SEEK MEDICAL CARE IF:   Your periods are late, irregular, or painful, or they stop.  Your pelvic pain or abdominal pain does not go away.  Your abdomen becomes larger or swollen.  You have pressure on your bladder or trouble emptying your bladder completely.  You have pain during sexual intercourse.  You have feelings of fullness, pressure, or discomfort in your stomach.  You lose weight for no apparent reason.  You feel generally ill.  You become constipated.  You lose your appetite.  You develop acne.  You have an increase in body and facial hair.  You are gaining weight, without changing your exercise and eating habits.  You think you are pregnant. SEEK IMMEDIATE MEDICAL CARE IF:   You have increasing abdominal pain.  You feel sick to your stomach (nauseous), and you throw up (vomit).  You develop a fever that comes on suddenly.  You have abdominal pain during a bowel movement.  Your menstrual periods become heavier than usual. MAKE SURE YOU:  Understand these instructions.  Will watch your condition.  Will get help right away if you are not doing well or get worse.   This information is not  intended to replace advice given to you by your health care provider. Make sure you discuss any questions you have with your health care provider.   Document Released: 08/29/2005 Document Revised: 09/03/2013 Document Reviewed: 05/06/2013 Elsevier Interactive Patient Education [employer].

## 2015-12-16 NOTE — Progress Notes (Signed)
Patient ID: Cathy Bryan, female   DOB: 1985/10/15, 30 y.o.   MRN: [id number] History:  30 y.o. G0P0000 here today for eval of pelvic mass.  Pt was initially dx'd with pelvic mass in 2015.  She was told that it was 9cm at the time.   Pt reports cycles q 1 1/2 months.  She reports that she had dysmenorrhea at menarche. It has gotten worse over the years. Pt reports very painful cycles with only limited pain between cycles.  She ws seen by RE[facility] in [place] at the [facility]. She was placed on Agestin with relief of the pain but, lost her insurance due to aging out of her parents insurance and had to stop the meds.  She is taking Motrin for the pain.  She reports that anything else would effect her performance in grad school.       The following portions of the patient's history were reviewed and updated as appropriate: allergies, current medications, past family history, past medical history, past social history, past surgical history and problem list.  Review of Systems:  Pertinent items are noted in HPI.  Objective:  Physical Exam Blood pressure 150/92, pulse 97, temperature 97.7 F (36.5 C), temperature source Oral, height 5\' 7"  (1.702 m), weight 308 lb (139.708 kg), last menstrual period 12/07/2015. Gen: NAD Lungs: CTA CV: RRR Abd: obese; Soft, nontender and nondistended Pelvic: Normal appearing external genitalia; normal appearing vaginal mucosa and cervix.  Normal discharge.  Small uterus, adnexal fullness palpable on right side.  Exam limited by patients body habitus.  Labs and Imaging Mr Pelvis W Wo Contrast  12/10/2015  CLINICAL DATA:  Pelvic pain. Large complex adnexal mass seen on recent ultrasound. Fibroids. EXAM: MRI PELVIS WITHOUT AND WITH CONTRAST TECHNIQUE: Multiplanar multisequence MR imaging of the pelvis was performed both before and after administration of intravenous contrast. CONTRAST:  53mL MULTIHANCE GADOBENATE DIMEGLUMINE 529 MG/ML IV SOLN COMPARISON:  Pelvic  ultrasound on 12/09/2015 FINDINGS: Urinary Tract:  Bladder appears normal. Bowel:  Unremarkable visualized pelvic bowel loops. Vascular/Lymphatic: No pathologically enlarged lymph nodes. No significant vascular abnormality seen. Uterus: Measures 7.6 x 5.7 x 6.3 cm. Several small intramural fibroids are seen which range in size from less than 1 cm the largest in the right anterior corpus measuring 3.5 cm and showing lack of contrast enhancement due to internal degeneration. Endometrium:  Unremarkable. Cervix/Vagina:  Appears normal. Right ovary:  Not well visualized. Left ovary:  Not well visualized. Other: A large complex cystic mass is seen within the central pelvis superior to the uterus which involves both adnexal regions. This measures 7.3 x 11.0 by 9.1 cm. This lesion shows thickened septations, with multiple cystic components demonstrating T1 hyperintensity and low T2 signal intensity shading. No internal fat is seen. These findings favor a large endometrioma. Mucinous cystic ovarian neoplasm is considered less likely but cannot definitely be excluded. No evidence of ascites. Musculoskeletal:  Unremarkable. IMPRESSION: 11 cm complex cystic mass in the central pelvis superior to the uterus, which involves both adnexal regions. MR characteristics favor a large endometrioma. Mucinous cystic ovarian neoplasm is considered less likely, but cannot definitely be excluded. Surgical evaluation should be considered. Small uterine fibroids measuring up to 3.5 cm. Electronically Signed   By: Earle Gell M.D.   On: 12/10/2015 08:57   US Transvaginal Non-ob  12/09/2015  CLINICAL DATA:  30 year old female with 3 days of left lower quadrant pain. LMP 12/07/2015. EXAM: TRANSABDOMINAL AND TRANSVAGINAL ULTRASOUND OF PELVIS TECHNIQUE: Both transabdominal  and transvaginal ultrasound examinations of the pelvis were performed. Transabdominal technique was performed for global imaging of the pelvis including uterus, ovaries,  adnexal regions, and pelvic cul-de-sac. It was necessary to proceed with endovaginal exam following the transabdominal exam to visualize the endometrium, myometrium and adnexa. COMPARISON:  None FINDINGS: Uterus Measurements: 10.5 x 3.6 x 4.0 cm. The anteverted anteflexed uterus is mildly enlarged by fibroids as follows: - right uterine body intramural 3.1 x 3.0 x 3.4 cm fibroid - posterior uterine body intramural 2.5 x 1.7 x 1.5 cm fibroid - questionable intramural anterior lower uterine segment 0.8 x 0.6 x 1.2 cm fibroid Endometrium Thickness: 1 mm. No endometrial cavity fluid or focal endometrial mass. Adnexa: There is a large complex cystic mass superior to the uterine fundus measuring 7.5 x 9.1 x 11.3 cm, which demonstrates multiple complex internal cystic locules separated by thickened internal septations, with heterogeneous internal echoes within the locules and with questionable internal vascularity at the periphery of the mass on color Doppler. Normal ovaries are not visualized on either side on either the transabdominal or transvaginal portions of the study. Other findings No abnormal free fluid. IMPRESSION: 1. Indeterminate large complex 11.3 cm multiloculated cystic mass superior to the uterine fundus. Normal ovaries are not visualized on either side. A primary ovarian malignancy such as a cystadenocarcinoma is the diagnosis of exclusion. Recommend gynecology consultation and correlation with serum tumor markers. Consider further evaluation with MRI pelvis without and with IV contrast. 2. Mildly enlarged myomatous uterus.  No endometrial abnormality. Electronically Signed   By: Ilona Sorrel M.D.   On: 12/09/2015 22:24   US Pelvis Complete  12/09/2015  CLINICAL DATA:  30 year old female with 3 days of left lower quadrant pain. LMP 12/07/2015. EXAM: TRANSABDOMINAL AND TRANSVAGINAL ULTRASOUND OF PELVIS TECHNIQUE: Both transabdominal and transvaginal ultrasound examinations of the pelvis were performed.  Transabdominal technique was performed for global imaging of the pelvis including uterus, ovaries, adnexal regions, and pelvic cul-de-sac. It was necessary to proceed with endovaginal exam following the transabdominal exam to visualize the endometrium, myometrium and adnexa. COMPARISON:  None FINDINGS: Uterus Measurements: 10.5 x 3.6 x 4.0 cm. The anteverted anteflexed uterus is mildly enlarged by fibroids as follows: - right uterine body intramural 3.1 x 3.0 x 3.4 cm fibroid - posterior uterine body intramural 2.5 x 1.7 x 1.5 cm fibroid - questionable intramural anterior lower uterine segment 0.8 x 0.6 x 1.2 cm fibroid Endometrium Thickness: 1 mm. No endometrial cavity fluid or focal endometrial mass. Adnexa: There is a large complex cystic mass superior to the uterine fundus measuring 7.5 x 9.1 x 11.3 cm, which demonstrates multiple complex internal cystic locules separated by thickened internal septations, with heterogeneous internal echoes within the locules and with questionable internal vascularity at the periphery of the mass on color Doppler. Normal ovaries are not visualized on either side on either the transabdominal or transvaginal portions of the study. Other findings No abnormal free fluid. IMPRESSION: 1. Indeterminate large complex 11.3 cm multiloculated cystic mass superior to the uterine fundus. Normal ovaries are not visualized on either side. A primary ovarian malignancy such as a cystadenocarcinoma is the diagnosis of exclusion. Recommend gynecology consultation and correlation with serum tumor markers. Consider further evaluation with MRI pelvis without and with IV contrast. 2. Mildly enlarged myomatous uterus.  No endometrial abnormality. Electronically Signed   By: Ilona Sorrel M.D.   On: 12/09/2015 22:24    Assessment & Plan:  11cm pelvic mass.  Suspect endometrioma. Uterine fibroids-  asymptomatic  Lab: CA125 Need Records from: [facility] in [place] and [facility] IV[facility] clinic.    Need to  complete the financial aid paperwork F/u in 4 weeks or sooner prn  Discussed with pt treatment options including Ov cystectomy and removal of suspected endometrioma.  Need to review prev records first.  Pt also wants to discuss with her parents.  She is concerned about future fertility.  Discussed surgery for endometriosis and possible.risks and endometriosis and relationship to fertility.   Syana Degraffenreid L. [name], M.D., Cherlynn June

## 2015-12-17 LAB — CA 125: CA 125: 51 U/mL — ABNORMAL HIGH (ref <35)

## 2015-12-28 ENCOUNTER — Encounter: Payer: Self-pay | Admitting: *Deleted

## 2016-02-11 ENCOUNTER — Ambulatory Visit (INDEPENDENT_AMBULATORY_CARE_PROVIDER_SITE_OTHER): Payer: Self-pay | Admitting: Obstetrics & Gynecology

## 2016-02-11 VITALS — BP 147/95 | HR 93 | Ht 67.0 in | Wt 313.0 lb

## 2016-02-11 DIAGNOSIS — N949 Unspecified condition associated with female genital organs and menstrual cycle: Secondary | ICD-10-CM

## 2016-02-11 DIAGNOSIS — I10 Essential (primary) hypertension: Secondary | ICD-10-CM

## 2016-02-11 DIAGNOSIS — N9489 Other specified conditions associated with female genital organs and menstrual cycle: Secondary | ICD-10-CM

## 2016-02-11 NOTE — Patient Instructions (Signed)
Endometriosis Endometriosis is a condition in which the tissue that lines the uterus (endometrium) grows outside of its normal location. The tissue may grow in many locations close to the uterus, but it commonly grows on the ovaries, fallopian tubes, vagina, or bowel. Because the uterus expels, or sheds, its lining every menstrual cycle, there is bleeding wherever the endometrial tissue is located. This can cause pain because blood is irritating to tissues not normally exposed to it.  CAUSES  The cause of endometriosis is not known.  SIGNS AND SYMPTOMS  Often, there are no symptoms. When symptoms are present, they can vary with the location of the displaced tissue. Various symptoms can occur at different times. Although symptoms occur mainly during a woman's menstrual period, they can also occur midcycle and usually stop with menopause. Some people may go months with no symptoms at all. Symptoms may include:   Back or abdominal pain.   Heavier bleeding during periods.   Pain during intercourse.   Painful bowel movements.   Infertility. DIAGNOSIS  Your health care provider will do a physical exam and ask about your symptoms. Various tests may be done, such as:   Blood tests and urine tests. These are done to help rule out other problems.   Ultrasound. This test is done to look for abnormal tissue.   An X-ray of the lower bowel (barium enema).  Laparoscopy. In this procedure, a thin, lighted tube with a tiny camera on the end (laparoscope) is inserted into your abdomen. This helps your health care provider look for abnormal tissue to confirm the diagnosis. The health care provider may also remove a small piece of tissue (biopsy) from any abnormal tissue found. This tissue sample can then be sent to a lab so it can be looked at under a microscope. TREATMENT  Treatment will vary and may include:   Medicines to relieve pain. Nonsteroidal anti-inflammatory drugs (NSAIDs) are a type of  pain medicine that can help to relieve the pain caused by endometriosis.  Hormonal therapy. When using hormonal therapy, periods are eliminated. This eliminates the monthly exposure to blood by the displaced endometrial tissue.   Surgery. Surgery may sometimes be done to remove the abnormal endometrial tissue. In severe cases, surgery may be done to remove the fallopian tubes, uterus, and ovaries (hysterectomy). HOME CARE INSTRUCTIONS   Take all medicines as directed by your health care provider. Do not take aspirin because it may increase bleeding when you are not on hormonal therapy.   Avoid activities that produce pain, including sexual activity. SEEK MEDICAL CARE IF:  You have pelvic pain before, after, or during your periods.  You have pelvic pain between periods that gets worse during your period.  You have pelvic pain during or after sex.  You have pelvic pain with bowel movements or urination, especially during your period.  You have problems getting pregnant.  You have a fever. SEEK IMMEDIATE MEDICAL CARE IF:   Your pain is severe and is not responding to pain medicine.   You have severe nausea and vomiting, or you cannot keep foods down.   You have pain that is limited to the right lower part of your abdomen.   You have swelling or increasing pain in your abdomen.   You see blood in your stool.  MAKE SURE YOU:   Understand these instructions.  Will watch your condition.  Will get help right away if you are not doing well or get worse.   This information   is not intended to replace advice given to you by your health care provider. Make sure you discuss any questions you have with your health care provider.   Document Released: 08/26/2000 Document Revised: 09/19/2014 Document Reviewed: 04/26/2013 Elsevier Interactive Patient Education 2016 [employer]  

## 2016-02-11 NOTE — Progress Notes (Signed)
Patient ID: Cathy Bryan, female   DOB: November 26, 1985, 30 y.o.   MRN: [id number] History:  30 y.o. G0P0000 here today for f/u of adnexal mass.  Pt reports intermittent pain.    The following portions of the patient's history were reviewed and updated as appropriate: allergies, current medications, past family history, past medical history, past social history, past surgical history and problem list.  Review of Systems:  Pertinent items are noted in HPI.  Objective:  Physical Exam BP 147/95 mmHg  Pulse 93  Ht 5\' 7"  (1.702 m)  Wt 313 lb (141.976 kg)  BMI 49.01 kg/m2  LMP 12/07/2015 (Exact Date)  Gen: NAD Abd: Soft, nontender and nondistended; obese Pelvic: not repeated today  Labs and Imaging   12/10/2015 CLINICAL DATA: Pelvic pain. Large complex adnexal mass seen on recent ultrasound. Fibroids.  EXAM: MRI PELVIS WITHOUT AND WITH CONTRAST  TECHNIQUE: Multiplanar multisequence MR imaging of the pelvis was performed both before and after administration of intravenous contrast.  CONTRAST: 12mL MULTIHANCE GADOBENATE DIMEGLUMINE 529 MG/ML IV SOLN  COMPARISON: Pelvic ultrasound on 12/09/2015  FINDINGS: Urinary Tract: Bladder appears normal.  [name]: Unremarkable visualized pelvic bowel loops.  CLINICAL DATA: 30 year old female with 3 days of left lower quadrant pain. LMP 12/07/2015.  EXAM: TRANSABDOMINAL AND TRANSVAGINAL ULTRASOUND OF PELVIS  TECHNIQUE: Both transabdominal and transvaginal ultrasound examinations of the pelvis were performed. Transabdominal technique was performed for global imaging of the pelvis including uterus, ovaries, adnexal regions, and pelvic cul-de-sac. It was necessary to proceed with endovaginal exam following the transabdominal exam to visualize the endometrium, myometrium and adnexa.  COMPARISON: None  FINDINGS: Uterus  Measurements: 10.5 x 3.6 x 4.0 cm. The anteverted anteflexed uterus is mildly enlarged by fibroids  as follows:  - right uterine body intramural 3.1 x 3.0 x 3.4 cm fibroid  - posterior uterine body intramural 2.5 x 1.7 x 1.5 cm fibroid  - questionable intramural anterior lower uterine segment 0.8 x 0.6 x 1.2 cm fibroid  Endometrium  Thickness: 1 mm. No endometrial cavity fluid or focal endometrial mass.  Adnexa:  There is a large complex cystic mass superior to the uterine fundus measuring 7.5 x 9.1 x 11.3 cm, which demonstrates multiple complex internal cystic locules separated by thickened internal septations, with heterogeneous internal echoes within the locules and with questionable internal vascularity at the periphery of the mass on color Doppler. Normal ovaries are not visualized on either side on either the transabdominal or transvaginal portions of the study.  Other findings  No abnormal free fluid.  IMPRESSION: 1. Indeterminate large complex 11.3 cm multiloculated cystic mass superior to the uterine fundus. Normal ovaries are not visualized on either side. A primary ovarian malignancy such as a cystadenocarcinoma is the diagnosis of exclusion. Recommend gynecology consultation and correlation with serum tumor markers. Consider further evaluation with MRI pelvis without and with IV contrast. 2. Mildly enlarged myomatous uterus. No endometrial abnormality.  Vascular/Lymphatic: No pathologically enlarged lymph nodes. No significant vascular abnormality seen.  Uterus: Measures 7.6 x 5.7 x 6.3 cm. Several small intramural fibroids are seen which range in size from less than 1 cm the largest in the right anterior corpus measuring 3.5 cm and showing lack of contrast enhancement due to internal degeneration.  Endometrium: Unremarkable.  Cervix/Vagina: Appears normal.  Right ovary: Not well visualized.  Left ovary: Not well visualized.  Other: A large complex cystic mass is seen within the central pelvis superior to the uterus which  involves both adnexal regions. This measures 7.3  x 11.0 by 9.1 cm. This lesion shows thickened /eptations, with multiple cystic components demonstrating T1 hyperintensity and low T2 signal intensity shading. No internal fat is seen. These findings favor a large endometrioma. Mucinous cystic ovarian neoplasm is considered less likely but cannot definitely be excluded. No evidence of ascites.  Musculoskeletal: Unremarkable.  IMPRESSION: 11 cm complex cystic mass in the central pelvis superior to the uterus, which involves both adnexal regions. MR characteristics favor a large endometrioma. Mucinous cystic ovarian neoplasm is considered less likely, but cannot definitely be excluded. Surgical evaluation should be considered.  Small uterine fibroids measuring up to 3.5 cm.  Assessment & Plan:   Patient desires surgical management with laparotomy with resection of right endometrioma and possible remvoal of right ovary.  The risks of surgery were discussed in detail with the patient including but not limited to: bleeding which may require transfusion or reoperation; infection which may require prolonged hospitalization or re-hospitalization and antibiotic therapy; injury to bowel, bladder, ureters and major vessels or other surrounding organs; need for additional procedures including laparotomy; thromboembolic phenomenon, incisional problems and other postoperative or anesthesia complications.  Patient was told that the likelihood that her condition and symptoms will be treated effectively with this surgical management was very high; the postoperative expectations were also discussed in detail. The patient also understands the alternative treatment options which were discussed in full. All questions were answered.  She was told that she will be contacted by our surgical scheduler regarding the time and date of her surgery; routine preoperative instructions of having nothing to eat or drink after  midnight on the day prior to surgery and also coming to the hospital 1 1/2 hours prior to her time of surgery were also emphasized.  She was told she may be called for a preoperative appointment about a week prior to surgery and will be given further preoperative instructions at that visit. Printed patient education handouts about the procedure were given to the patient to review at home.  Andee Chivers L. [name], M.D., Cherlynn June

## 2016-02-15 ENCOUNTER — Encounter (HOSPITAL_COMMUNITY): Payer: Self-pay | Admitting: *Deleted

## 2016-02-19 ENCOUNTER — Ambulatory Visit: Payer: Self-pay | Admitting: Family Medicine

## 2016-03-24 ENCOUNTER — Encounter (HOSPITAL_COMMUNITY): Payer: Self-pay | Admitting: *Deleted

## 2016-03-30 NOTE — Patient Instructions (Addendum)
Your procedure is scheduled on:  Tuesday, April 19, 2016  Enter through the [employer] [facility] at: 7:30 AM  Pick up the phone at the desk and dial [phone].  Call this number if you have problems the morning of surgery: [phone].  Remember: Do NOT eat food or drink after:  Midnight Monday, April 18, 2016  Take these medicines the morning of surgery with a SIP OF WATER:  Hydrochlorothiazide  Do NOT smoke the day of surgery  Stop taking Garcinia Cambogia 04/06/2016  Do NOT wear jewelry (body piercing), metal hair clips/bobby pins, make-up, or nail polish. Do NOT wear lotions, powders, or perfumes.  You may wear deodorant. Do NOT shave for 48 hours prior to surgery. Do NOT bring valuables to the hospital. Contacts, dentures, or bridgework may not be worn into surgery.  Have a responsible adult drive you home and stay with you for 24 hours after your procedure

## 2016-03-31 ENCOUNTER — Encounter (HOSPITAL_COMMUNITY): Payer: Self-pay

## 2016-03-31 ENCOUNTER — Telehealth: Payer: Self-pay | Admitting: Family Medicine

## 2016-03-31 ENCOUNTER — Encounter (HOSPITAL_COMMUNITY)
Admission: RE | Admit: 2016-03-31 | Discharge: 2016-03-31 | Disposition: A | Discharge status: Home / Self Care | Payer: Self-pay | Source: Ambulatory Visit | Attending: Obstetrics & Gynecology | Admitting: Obstetrics & Gynecology

## 2016-03-31 DIAGNOSIS — Z01812 Encounter for preprocedural laboratory examination: Secondary | ICD-10-CM | POA: Insufficient documentation

## 2016-03-31 DIAGNOSIS — I10 Essential (primary) hypertension: Secondary | ICD-10-CM

## 2016-03-31 HISTORY — DX: Major depressive disorder, single episode, unspecified: F32.9

## 2016-03-31 HISTORY — DX: Depression, unspecified: F32.A

## 2016-03-31 HISTORY — DX: Anxiety disorder, unspecified: F41.9

## 2016-03-31 LAB — BASIC METABOLIC PANEL
Anion gap: 6 (ref 5–15)
BUN: 10 mg/dL (ref 6–20)
CALCIUM: 8.8 mg/dL — AB (ref 8.9–10.3)
CO2: 25 mmol/L (ref 22–32)
CREATININE: 0.72 mg/dL (ref 0.44–1.00)
Chloride: 106 mmol/L (ref 101–111)
Glucose, Bld: 97 mg/dL (ref 65–99)
Potassium: 3.7 mmol/L (ref 3.5–5.1)
SODIUM: 137 mmol/L (ref 135–145)

## 2016-03-31 LAB — CBC
HCT: 35.7 % — ABNORMAL LOW (ref 36.0–46.0)
HEMOGLOBIN: 12.3 g/dL (ref 12.0–15.0)
MCH: 27.5 pg (ref 26.0–34.0)
MCHC: 34.5 g/dL (ref 30.0–36.0)
MCV: 79.7 fL (ref 78.0–100.0)
PLATELETS: UNDETERMINED 10*3/uL (ref 150–400)
RBC: 4.48 MIL/uL (ref 3.87–5.11)
RDW: 14.3 % (ref 11.5–15.5)
WBC: 9.5 10*3/uL (ref 4.0–10.5)

## 2016-03-31 MED ORDER — HYDROCHLOROTHIAZIDE 25 MG PO TABS: 25.0000 mg | ORAL_TABLET | Freq: Every day | ORAL | Status: DC

## 2016-03-31 NOTE — Pre-Procedure Instructions (Addendum)
Cathy Bryan presents to pre op appointment with elevated blood pressure.  She denies headache or visual changes.  Cathy Bryan states she needs to refill her Hydrochlorothiazide.  She has not taken her blood pressure medication in over a week.  Cathy Bryan was instructed to take her Hyrdochlorothiazide daily and monitor her blood pressures at home.  She was informed that if she presents day of surgery with elevated blood pressure her surgery maybe cancelled.  Cathy Bryan verbalized understanding.

## 2016-03-31 NOTE — Telephone Encounter (Signed)
Medication Refill: hydrochlorothiazide (HYDRODIURIL) 25 MG tablet  Pt states she is completely out of these pills Pt has an appointment scheduled with PCP on 7/31 Pt would like a small refill to get her through until appointment if possible Thank you

## 2016-03-31 NOTE — Telephone Encounter (Signed)
HCTZ refilled x 30 days.

## 2016-04-11 ENCOUNTER — Encounter: Payer: Self-pay | Admitting: Family Medicine

## 2016-04-11 ENCOUNTER — Ambulatory Visit: Payer: Self-pay | Attending: Family Medicine | Admitting: Family Medicine

## 2016-04-11 VITALS — BP 143/87 | Temp 98.5°F | Ht 67.0 in | Wt 311.6 lb

## 2016-04-11 DIAGNOSIS — N7093 Salpingitis and oophoritis, unspecified: Secondary | ICD-10-CM

## 2016-04-11 DIAGNOSIS — I10 Essential (primary) hypertension: Secondary | ICD-10-CM | POA: Insufficient documentation

## 2016-04-11 DIAGNOSIS — Z79899 Other long term (current) drug therapy: Secondary | ICD-10-CM | POA: Insufficient documentation

## 2016-04-11 DIAGNOSIS — R1909 Other intra-abdominal and pelvic swelling, mass and lump: Secondary | ICD-10-CM | POA: Insufficient documentation

## 2016-04-11 DIAGNOSIS — M5431 Sciatica, right side: Secondary | ICD-10-CM

## 2016-04-11 DIAGNOSIS — M5430 Sciatica, unspecified side: Secondary | ICD-10-CM | POA: Insufficient documentation

## 2016-04-11 DIAGNOSIS — Z6841 Body Mass Index (BMI) 40.0 and over, adult: Secondary | ICD-10-CM | POA: Insufficient documentation

## 2016-04-11 LAB — LIPID PANEL
CHOL/HDL RATIO: 2.2 ratio (ref <=5.0)
CHOLESTEROL: 90 mg/dL — AB (ref 125–200)
HDL: 41 mg/dL — ABNORMAL LOW (ref >=46)
LDL CALC: 37 mg/dL (ref <130)
Triglycerides: 58 mg/dL (ref <150)
VLDL: 12 mg/dL (ref <30)

## 2016-04-11 LAB — COMPLETE METABOLIC PANEL WITH GFR
ALT: 12 U/L (ref 6–29)
AST: 15 U/L (ref 10–30)
Albumin: 3.8 g/dL (ref 3.6–5.1)
Alkaline Phosphatase: 45 U/L (ref 33–115)
BUN: 11 mg/dL (ref 7–25)
CALCIUM: 9.1 mg/dL (ref 8.6–10.2)
CHLORIDE: 105 mmol/L (ref 98–110)
CO2: 24 mmol/L (ref 20–31)
Creat: 0.62 mg/dL (ref 0.50–1.10)
GFR, Est African American: >89 mL/min (ref >=60)
GFR, Est Non African American: >89 mL/min (ref >=60)
Glucose, Bld: 97 mg/dL (ref 65–99)
POTASSIUM: 4 mmol/L (ref 3.5–5.3)
Sodium: 138 mmol/L (ref 135–146)
Total Bilirubin: 0.4 mg/dL (ref 0.2–1.2)
Total Protein: 6.7 g/dL (ref 6.1–8.1)

## 2016-04-11 MED ORDER — LISINOPRIL-HYDROCHLOROTHIAZIDE 10-12.5 MG PO TABS: 1.0000 | ORAL_TABLET | Freq: Every day | ORAL | 3 refills | Status: DC

## 2016-04-11 NOTE — Progress Notes (Signed)
Requesting blood work today- fasting today

## 2016-04-11 NOTE — Patient Instructions (Signed)
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
## 2016-04-18 ENCOUNTER — Telehealth: Payer: Self-pay

## 2016-04-18 NOTE — Telephone Encounter (Signed)
-----   Message from Arnoldo Morale, MD sent at 04/12/2016  1:36 PM EDT ----- Please inform the patient that labs are normal. Thank you.

## 2016-04-18 NOTE — Telephone Encounter (Signed)
Writer called patient and was able to reach her to let her know per Dr. Jarold Song that all her lab results came back normal.  Patient stated an understanding.

## 2016-04-19 ENCOUNTER — Inpatient Hospital Stay (HOSPITAL_COMMUNITY)
Admission: RE | Admit: 2016-04-19 | Discharge: 2016-04-21 | DRG: 742 | Disposition: A | Discharge status: Home / Self Care | Payer: Self-pay | Source: Ambulatory Visit | Attending: Obstetrics & Gynecology | Admitting: Obstetrics & Gynecology

## 2016-04-19 ENCOUNTER — Encounter (HOSPITAL_COMMUNITY): Payer: Self-pay

## 2016-04-19 ENCOUNTER — Ambulatory Visit (HOSPITAL_COMMUNITY): Payer: Self-pay | Admitting: Anesthesiology

## 2016-04-19 ENCOUNTER — Encounter (HOSPITAL_COMMUNITY)
Admission: RE | Disposition: A | Discharge status: Home / Self Care | Payer: Self-pay | Source: Ambulatory Visit | Attending: Obstetrics & Gynecology

## 2016-04-19 DIAGNOSIS — N736 Female pelvic peritoneal adhesions (postinfective): Secondary | ICD-10-CM

## 2016-04-19 DIAGNOSIS — Z8249 Family history of ischemic heart disease and other diseases of the circulatory system: Secondary | ICD-10-CM

## 2016-04-19 DIAGNOSIS — I10 Essential (primary) hypertension: Secondary | ICD-10-CM | POA: Diagnosis present

## 2016-04-19 DIAGNOSIS — Z9889 Other specified postprocedural states: Secondary | ICD-10-CM

## 2016-04-19 DIAGNOSIS — N80129 Deep endometriosis of ovary, unspecified ovary: Secondary | ICD-10-CM

## 2016-04-19 DIAGNOSIS — Z6841 Body Mass Index (BMI) 40.0 and over, adult: Secondary | ICD-10-CM

## 2016-04-19 DIAGNOSIS — N801 Endometriosis of ovary: Secondary | ICD-10-CM

## 2016-04-19 DIAGNOSIS — K36 Other appendicitis: Secondary | ICD-10-CM

## 2016-04-19 DIAGNOSIS — D251 Intramural leiomyoma of uterus: Secondary | ICD-10-CM | POA: Diagnosis present

## 2016-04-19 DIAGNOSIS — N800 Endometriosis of uterus: Principal | ICD-10-CM | POA: Diagnosis present

## 2016-04-19 HISTORY — PX: APPENDECTOMY: SHX54

## 2016-04-19 HISTORY — PX: LAPAROTOMY: SHX154

## 2016-04-19 LAB — CBC
HCT: 34.7 % — ABNORMAL LOW (ref 36.0–46.0)
HEMATOCRIT: 37.9 % (ref 36.0–46.0)
HEMOGLOBIN: 12.8 g/dL (ref 12.0–15.0)
Hemoglobin: 12.2 g/dL (ref 12.0–15.0)
MCH: 26.9 pg (ref 26.0–34.0)
MCH: 27.7 pg (ref 26.0–34.0)
MCHC: 33.8 g/dL (ref 30.0–36.0)
MCHC: 35.2 g/dL (ref 30.0–36.0)
MCV: 79.8 fL (ref 78.0–100.0)
MCV: 78.9 fL (ref 78.0–100.0)
Platelets: 137 10*3/uL — ABNORMAL LOW (ref 150–400)
RBC: 4.75 MIL/uL (ref 3.87–5.11)
RBC: 4.4 MIL/uL (ref 3.87–5.11)
RDW: 14.1 % (ref 11.5–15.5)
RDW: 14.1 % (ref 11.5–15.5)
WBC: 10.9 10*3/uL — AB (ref 4.0–10.5)
WBC: 18 10*3/uL — AB (ref 4.0–10.5)

## 2016-04-19 LAB — PREGNANCY, URINE: PREG TEST UR: NEGATIVE

## 2016-04-19 SURGERY — LAPAROTOMY
Anesthesia: General | Site: Abdomen | Laterality: Right

## 2016-04-19 MED ORDER — NALOXONE HCL 0.4 MG/ML IJ SOLN: 0.4000 mg | INTRAMUSCULAR | Status: DC | PRN

## 2016-04-19 MED ORDER — GLYCOPYRROLATE 0.2 MG/ML IJ SOLN
INTRAMUSCULAR | Status: AC
  Filled 2016-04-19: qty 3

## 2016-04-19 MED ORDER — MIDAZOLAM HCL 5 MG/5ML IJ SOLN
INTRAMUSCULAR | Status: DC | PRN
  Administered 2016-04-19: 2 mg via INTRAVENOUS

## 2016-04-19 MED ORDER — PROPOFOL 10 MG/ML IV BOLUS
INTRAVENOUS | Status: AC
  Filled 2016-04-19: qty 20

## 2016-04-19 MED ORDER — BUPIVACAINE HCL (PF) 0.5 % IJ SOLN
INTRAMUSCULAR | Status: DC | PRN
  Administered 2016-04-19: 30 mL

## 2016-04-19 MED ORDER — ONDANSETRON HCL 4 MG/2ML IJ SOLN: 4.0000 mg | Freq: Four times a day (QID) | INTRAMUSCULAR | Status: DC | PRN

## 2016-04-19 MED ORDER — DIPHENHYDRAMINE HCL 12.5 MG/5ML PO ELIX: 12.5000 mg | ORAL_SOLUTION | Freq: Four times a day (QID) | ORAL | Status: DC | PRN

## 2016-04-19 MED ORDER — FAMOTIDINE IN NACL 20-0.9 MG/50ML-% IV SOLN
20.0000 mg | Freq: Two times a day (BID) | INTRAVENOUS | Status: DC
  Administered 2016-04-19 – 2016-04-20 (×4): 20 mg via INTRAVENOUS
  Filled 2016-04-19 (×5): qty 50

## 2016-04-19 MED ORDER — FENTANYL CITRATE (PF) 100 MCG/2ML IJ SOLN
INTRAMUSCULAR | Status: DC | PRN
  Administered 2016-04-19: 100 ug via INTRAVENOUS
  Administered 2016-04-19: 150 ug via INTRAVENOUS

## 2016-04-19 MED ORDER — CEFAZOLIN SODIUM 10 G IJ SOLR
INTRAMUSCULAR | Status: AC
  Filled 2016-04-19: qty 3000

## 2016-04-19 MED ORDER — HYDROCHLOROTHIAZIDE 12.5 MG PO CAPS
12.5000 mg | ORAL_CAPSULE | Freq: Every day | ORAL | Status: DC
  Filled 2016-04-19: qty 1

## 2016-04-19 MED ORDER — ONDANSETRON HCL 4 MG/2ML IJ SOLN
INTRAMUSCULAR | Status: DC | PRN
  Administered 2016-04-19: 4 mg via INTRAVENOUS

## 2016-04-19 MED ORDER — HYDROMORPHONE HCL 1 MG/ML IJ SOLN
INTRAMUSCULAR | Status: AC
  Administered 2016-04-19: 0.5 mg via INTRAVENOUS
  Filled 2016-04-19: qty 1

## 2016-04-19 MED ORDER — ONDANSETRON HCL 4 MG/2ML IJ SOLN
4.0000 mg | Freq: Four times a day (QID) | INTRAMUSCULAR | Status: DC | PRN
Start: 2016-04-19 — End: 2016-04-20

## 2016-04-19 MED ORDER — KETOROLAC TROMETHAMINE 30 MG/ML IJ SOLN
INTRAMUSCULAR | Status: DC | PRN
  Administered 2016-04-19: 30 mg via INTRAVENOUS

## 2016-04-19 MED ORDER — KETOROLAC TROMETHAMINE 30 MG/ML IJ SOLN
INTRAMUSCULAR | Status: AC
  Filled 2016-04-19: qty 1

## 2016-04-19 MED ORDER — HYDROMORPHONE HCL 1 MG/ML IJ SOLN
INTRAMUSCULAR | Status: AC
  Filled 2016-04-19: qty 1

## 2016-04-19 MED ORDER — LISINOPRIL 10 MG PO TABS
10.0000 mg | ORAL_TABLET | Freq: Every day | ORAL | Status: DC
  Administered 2016-04-20 – 2016-04-21 (×2): 10 mg via ORAL
  Filled 2016-04-19 (×3): qty 1

## 2016-04-19 MED ORDER — SIMETHICONE 80 MG PO CHEW: 80.0000 mg | CHEWABLE_TABLET | Freq: Four times a day (QID) | ORAL | Status: DC | PRN

## 2016-04-19 MED ORDER — ROCURONIUM BROMIDE 100 MG/10ML IV SOLN
INTRAVENOUS | Status: AC
  Filled 2016-04-19: qty 1

## 2016-04-19 MED ORDER — GLYCOPYRROLATE 0.2 MG/ML IJ SOLN
INTRAMUSCULAR | Status: DC | PRN
  Administered 2016-04-19: 0.6 mg via INTRAVENOUS

## 2016-04-19 MED ORDER — HYDROMORPHONE HCL 1 MG/ML IJ SOLN
0.2500 mg | INTRAMUSCULAR | Status: DC | PRN
  Administered 2016-04-19: 0.5 mg via INTRAVENOUS

## 2016-04-19 MED ORDER — ONDANSETRON HCL 4 MG/2ML IJ SOLN
INTRAMUSCULAR | Status: AC
  Filled 2016-04-19: qty 2

## 2016-04-19 MED ORDER — FENTANYL CITRATE (PF) 250 MCG/5ML IJ SOLN
INTRAMUSCULAR | Status: AC
  Filled 2016-04-19: qty 5

## 2016-04-19 MED ORDER — DEXAMETHASONE SODIUM PHOSPHATE 10 MG/ML IJ SOLN
INTRAMUSCULAR | Status: DC | PRN
  Administered 2016-04-19: 10 mg via INTRAVENOUS

## 2016-04-19 MED ORDER — DIPHENHYDRAMINE HCL 50 MG/ML IJ SOLN: 12.5000 mg | Freq: Four times a day (QID) | INTRAMUSCULAR | Status: DC | PRN

## 2016-04-19 MED ORDER — LACTATED RINGERS IV SOLN: INTRAVENOUS | Status: DC

## 2016-04-19 MED ORDER — LIDOCAINE HCL (CARDIAC) 20 MG/ML IV SOLN
INTRAVENOUS | Status: AC
  Filled 2016-04-19: qty 5

## 2016-04-19 MED ORDER — SCOPOLAMINE 1 MG/3DAYS TD PT72
1.0000 | MEDICATED_PATCH | Freq: Once | TRANSDERMAL | Status: DC
  Administered 2016-04-19: 1.5 mg via TRANSDERMAL

## 2016-04-19 MED ORDER — SODIUM CHLORIDE 0.9% FLUSH: 9.0000 mL | INTRAVENOUS | Status: DC | PRN

## 2016-04-19 MED ORDER — NEOSTIGMINE METHYLSULFATE 10 MG/10ML IV SOLN
INTRAVENOUS | Status: AC
  Filled 2016-04-19: qty 1

## 2016-04-19 MED ORDER — KCL IN DEXTROSE-NACL 20-5-0.45 MEQ/L-%-% IV SOLN
INTRAVENOUS | Status: DC
  Administered 2016-04-19 – 2016-04-20 (×3): via INTRAVENOUS
  Filled 2016-04-19 (×4): qty 1000

## 2016-04-19 MED ORDER — DEXAMETHASONE SODIUM PHOSPHATE 10 MG/ML IJ SOLN
INTRAMUSCULAR | Status: AC
  Filled 2016-04-19: qty 2

## 2016-04-19 MED ORDER — PROPOFOL 10 MG/ML IV BOLUS
INTRAVENOUS | Status: DC | PRN
  Administered 2016-04-19: 200 mg via INTRAVENOUS

## 2016-04-19 MED ORDER — CEFAZOLIN SODIUM-DEXTROSE 2-4 GM/100ML-% IV SOLN: 2.0000 g | Freq: Once | INTRAVENOUS | Status: DC

## 2016-04-19 MED ORDER — HYDROMORPHONE 1 MG/ML IV SOLN
INTRAVENOUS | Status: DC
  Administered 2016-04-19: 1.2 mg via INTRAVENOUS
  Administered 2016-04-19: 0.6 mg via INTRAVENOUS
  Administered 2016-04-19: 13:00:00 via INTRAVENOUS
  Administered 2016-04-20: 2.7 mg via INTRAVENOUS
  Administered 2016-04-20: 0.6 mg via INTRAVENOUS
  Administered 2016-04-20: 0.3 mg via INTRAVENOUS
  Filled 2016-04-19: qty 25

## 2016-04-19 MED ORDER — NEOSTIGMINE METHYLSULFATE 10 MG/10ML IV SOLN
INTRAVENOUS | Status: DC | PRN
  Administered 2016-04-19: 3.5 mg via INTRAVENOUS

## 2016-04-19 MED ORDER — HYDROMORPHONE HCL 1 MG/ML IJ SOLN
INTRAMUSCULAR | Status: DC | PRN
  Administered 2016-04-19: 1 mg via INTRAVENOUS

## 2016-04-19 MED ORDER — LACTATED RINGERS IV SOLN
INTRAVENOUS | Status: DC
  Administered 2016-04-19 (×3): via INTRAVENOUS

## 2016-04-19 MED ORDER — ONDANSETRON HCL 4 MG/2ML IJ SOLN: 4.0000 mg | Freq: Once | INTRAMUSCULAR | Status: DC | PRN

## 2016-04-19 MED ORDER — ONDANSETRON HCL 4 MG PO TABS: 4.0000 mg | ORAL_TABLET | Freq: Four times a day (QID) | ORAL | Status: DC | PRN

## 2016-04-19 MED ORDER — MEPERIDINE HCL 25 MG/ML IJ SOLN: 6.2500 mg | INTRAMUSCULAR | Status: DC | PRN

## 2016-04-19 MED ORDER — KETOROLAC TROMETHAMINE 30 MG/ML IJ SOLN: 30.0000 mg | Freq: Three times a day (TID) | INTRAMUSCULAR | Status: DC

## 2016-04-19 MED ORDER — 0.9 % SODIUM CHLORIDE (POUR BTL) OPTIME
TOPICAL | Status: DC | PRN
  Administered 2016-04-19 (×2): 1000 mL

## 2016-04-19 MED ORDER — KETOROLAC TROMETHAMINE 30 MG/ML IJ SOLN
30.0000 mg | Freq: Three times a day (TID) | INTRAMUSCULAR | Status: DC
  Administered 2016-04-19 – 2016-04-20 (×3): 30 mg via INTRAVENOUS
  Filled 2016-04-19 (×3): qty 1

## 2016-04-19 MED ORDER — LIDOCAINE HCL (CARDIAC) 20 MG/ML IV SOLN
INTRAVENOUS | Status: DC | PRN
  Administered 2016-04-19: 100 mg via INTRAVENOUS

## 2016-04-19 MED ORDER — LISINOPRIL-HYDROCHLOROTHIAZIDE 10-12.5 MG PO TABS: 1.0000 | ORAL_TABLET | Freq: Every day | ORAL | Status: DC

## 2016-04-19 MED ORDER — MIDAZOLAM HCL 2 MG/2ML IJ SOLN
INTRAMUSCULAR | Status: AC
  Filled 2016-04-19: qty 2

## 2016-04-19 MED ORDER — CEFAZOLIN SODIUM 10 G IJ SOLR
3.0000 g | Freq: Once | INTRAMUSCULAR | Status: AC
  Administered 2016-04-19: 3 g via INTRAVENOUS
  Filled 2016-04-19: qty 3000

## 2016-04-19 MED ORDER — ROCURONIUM BROMIDE 100 MG/10ML IV SOLN
INTRAVENOUS | Status: DC | PRN
  Administered 2016-04-19: 50 mg via INTRAVENOUS
  Administered 2016-04-19 (×3): 10 mg via INTRAVENOUS

## 2016-04-19 MED ORDER — SCOPOLAMINE 1 MG/3DAYS TD PT72
MEDICATED_PATCH | TRANSDERMAL | Status: AC
  Administered 2016-04-19: 1.5 mg via TRANSDERMAL
  Filled 2016-04-19: qty 1

## 2016-04-19 SURGICAL SUPPLY — 67 items
BARRIER ADHS 3X4 INTERCEED (GAUZE/BANDAGES/DRESSINGS) IMPLANT
BENZOIN TINCTURE PRP APPL 2/3 (GAUZE/BANDAGES/DRESSINGS) ×5 IMPLANT
BLADE SURG 10 STRL SS (BLADE) ×10 IMPLANT
CABLE HIGH FREQUENCY MONO STRZ (ELECTRODE) IMPLANT
CANISTER SUCT 3000ML (MISCELLANEOUS) ×5 IMPLANT
CATH ROBINSON RED A/P 16FR (CATHETERS) IMPLANT
CELLS DAT CNTRL 66122 CELL SVR (MISCELLANEOUS) IMPLANT
CLOSURE WOUND 1/2 X4 (GAUZE/BANDAGES/DRESSINGS) ×1
CLOTH BEACON ORANGE TIMEOUT ST (SAFETY) ×5 IMPLANT
CONT PATH 16OZ SNAP LID 3702 (MISCELLANEOUS) ×5 IMPLANT
DECANTER SPIKE VIAL GLASS SM (MISCELLANEOUS) ×5 IMPLANT
DISSECTOR ROUND CHERRY 3/8 STR (MISCELLANEOUS) ×20 IMPLANT
DRAPE WARM FLUID 44X44 (DRAPE) ×5 IMPLANT
DRSG COVADERM PLUS 2X2 (GAUZE/BANDAGES/DRESSINGS) IMPLANT
DRSG OPSITE POSTOP 3X4 (GAUZE/BANDAGES/DRESSINGS) IMPLANT
DURAPREP 26ML APPLICATOR (WOUND CARE) ×5 IMPLANT
ELECT BLADE 6.5 EXT (BLADE) ×5 IMPLANT
GAUZE SPONGE 4X4 12PLY STRL (GAUZE/BANDAGES/DRESSINGS) ×5 IMPLANT
GAUZE SPONGE 4X4 16PLY XRAY LF (GAUZE/BANDAGES/DRESSINGS) ×25 IMPLANT
GLOVE BIO SURGEON STRL SZ7 (GLOVE) ×10 IMPLANT
GLOVE BIOGEL PI IND STRL 7.0 (GLOVE) ×12 IMPLANT
GLOVE BIOGEL PI INDICATOR 7.0 (GLOVE) ×8
GOWN STRL REUS W/TWL LRG LVL3 (GOWN DISPOSABLE) ×20 IMPLANT
GOWN STRL REUS W/TWL XL LVL3 (GOWN DISPOSABLE) ×10 IMPLANT
HEMOSTAT ARISTA ABSORB 1G (MISCELLANEOUS) ×5 IMPLANT
MANIPULATOR UTERINE 4.5 ZUMI (MISCELLANEOUS) IMPLANT
NEEDLE HYPO 22GX1.5 SAFETY (NEEDLE) ×5 IMPLANT
NEEDLE INSUFFLATION 120MM (ENDOMECHANICALS) IMPLANT
NEEDLE SPNL 22GX7 QUINCKE BK (NEEDLE) IMPLANT
NS IRRIG 1000ML POUR BTL (IV SOLUTION) ×25 IMPLANT
PACK ABDOMINAL GYN (CUSTOM PROCEDURE TRAY) ×5 IMPLANT
PACK LAPAROSCOPY BASIN (CUSTOM PROCEDURE TRAY) IMPLANT
PAD ABD 7.5X8 STRL (GAUZE/BANDAGES/DRESSINGS) IMPLANT
PAD OB MATERNITY 4.3X12.25 (PERSONAL CARE ITEMS) ×5 IMPLANT
PENCIL SMOKE EVAC W/HOLSTER (ELECTROSURGICAL) IMPLANT
POUCH SPECIMEN RETRIEVAL 10MM (ENDOMECHANICALS) IMPLANT
PROTECTOR NERVE ULNAR (MISCELLANEOUS) ×10 IMPLANT
RETRACTOR WND ALEXIS 25 LRG (MISCELLANEOUS) IMPLANT
RTRCTR WOUND ALEXIS 18CM MED (MISCELLANEOUS)
RTRCTR WOUND ALEXIS 25CM LRG (MISCELLANEOUS)
SET IRRIG TUBING LAPAROSCOPIC (IRRIGATION / IRRIGATOR) IMPLANT
SHEARS HARMONIC ACE PLUS 36CM (ENDOMECHANICALS) IMPLANT
SLEEVE XCEL OPT CAN 5 100 (ENDOMECHANICALS) IMPLANT
SPONGE LAP 18X18 X RAY DECT (DISPOSABLE) ×20 IMPLANT
STAPLER VISISTAT 35W (STAPLE) IMPLANT
STRIP CLOSURE SKIN 1/2X4 (GAUZE/BANDAGES/DRESSINGS) ×4 IMPLANT
SUT VIC AB 0 CT1 18XCR BRD8 (SUTURE) ×3 IMPLANT
SUT VIC AB 0 CT1 27 (SUTURE) ×4
SUT VIC AB 0 CT1 27XBRD ANBCTR (SUTURE) ×6 IMPLANT
SUT VIC AB 0 CT1 8-18 (SUTURE) ×2
SUT VIC AB 3-0 CT1 27 (SUTURE) ×4
SUT VIC AB 3-0 CT1 TAPERPNT 27 (SUTURE) ×6 IMPLANT
SUT VIC AB 3-0 SH 27 (SUTURE) ×4
SUT VIC AB 3-0 SH 27X BRD (SUTURE) ×6 IMPLANT
SUT VIC AB 3-0 X1 27 (SUTURE) IMPLANT
SUT VIC AB 4-0 KS 27 (SUTURE) IMPLANT
SUT VICRYL 0 TIES 12 18 (SUTURE) IMPLANT
SUT VICRYL 0 UR6 27IN ABS (SUTURE) ×10 IMPLANT
SUT VICRYL 4-0 PS2 18IN ABS (SUTURE) ×5 IMPLANT
SYR CONTROL 10ML LL (SYRINGE) ×5 IMPLANT
SYRINGE 10CC LL (SYRINGE) ×5 IMPLANT
TOWEL OR 17X24 6PK STRL BLUE (TOWEL DISPOSABLE) ×20 IMPLANT
TRAY FOLEY CATH SILVER 14FR (SET/KITS/TRAYS/PACK) ×10 IMPLANT
TROCAR OPTI TIP 5M 100M (ENDOMECHANICALS) IMPLANT
TROCAR XCEL DIL TIP R 11M (ENDOMECHANICALS) IMPLANT
TROCAR XCEL NON-BLD 5MMX100MML (ENDOMECHANICALS) IMPLANT
WATER STERILE IRR 1000ML POUR (IV SOLUTION) IMPLANT

## 2016-04-19 NOTE — Addendum Note (Signed)
Addendum  created 04/19/16 1708 by Flossie Dibble, CRNA   Sign clinical note

## 2016-04-19 NOTE — Anesthesia Preprocedure Evaluation (Signed)
Anesthesia Evaluation  Patient identified by MRN, date of birth, ID band Patient awake    Reviewed: Allergy & Precautions, NPO status , Patient's Chart, lab work & pertinent test results  Airway Mallampati: I  TM Distance: >3 FB Neck ROM: Full    Dental   Pulmonary    Pulmonary exam normal        Cardiovascular hypertension, Pt. on medications Normal cardiovascular exam     Neuro/Psych    GI/Hepatic   Endo/Other    Renal/GU      Musculoskeletal   Abdominal   Peds  Hematology   Anesthesia Other Findings   Reproductive/Obstetrics                             Anesthesia Physical Anesthesia Plan  ASA: II  Anesthesia Plan: General   Post-op Pain Management:    Induction: Intravenous  Airway Management Planned: LMA  Additional Equipment:   Intra-op Plan:   Post-operative Plan: Extubation in OR  Informed Consent: I have reviewed the patients History and Physical, chart, labs and discussed the procedure including the risks, benefits and alternatives for the proposed anesthesia with the patient or authorized representative who has indicated his/her understanding and acceptance.     Plan Discussed with: CRNA and Surgeon  Anesthesia Plan Comments:         Anesthesia Quick Evaluation  

## 2016-04-19 NOTE — Progress Notes (Signed)
I received a [facility] consult from pt's nurse who stated that pt was tearful after learning that both ovaries had to be removed during surgery.   Cathy Bryan was surrounded by family and she stated that she has a good amount of support.  She stated that she did not want to think or talk about things at this moment.  She agreed that I could come and check in on her tomorrow.  [place], bcc Pager, [phone] 4:46 PM    04/19/16 1600  Clinical Encounter Type  Visited With Patient  Visit Type Spiritual support  Referral From Nurse  Spiritual Encounters  Spiritual Needs Emotional  Stress Factors  Patient Stress Factors Health changes

## 2016-04-19 NOTE — Anesthesia Procedure Notes (Signed)
Procedure Name: Intubation Date/Time: 04/19/2016 8:49 AM Performed by: Riki Sheer Pre-anesthesia Checklist: Patient identified, Emergency Drugs available, Suction available, Patient being monitored and Timeout performed Patient Re-evaluated:Patient Re-evaluated prior to inductionOxygen Delivery Method: Circle system utilized Preoxygenation: Pre-oxygenation with 100% oxygen Intubation Type: IV induction Ventilation: Mask ventilation without difficulty Laryngoscope Size: Miller and 2 Grade View: Grade I Tube type: Oral Tube size: 7.0 mm Number of attempts: 1 Airway Equipment and Method: Stylet Placement Confirmation: ETT inserted through vocal cords under direct vision,  positive ETCO2,  CO2 detector and breath sounds checked- equal and bilateral Secured at: 22 cm Tube secured with: Tape Dental Injury: Teeth and Oropharynx as per pre-operative assessment

## 2016-04-19 NOTE — Anesthesia Postprocedure Evaluation (Signed)
Anesthesia Post Note  Patient: Cathy Bryan  Procedure(s) Performed: Procedure(s) (LRB): EXPLORATORY LAPAROTOMY WITH BILATERAL OOPHORECTOMY  LYSIS OF ADHESIONS (Right) APPENDECTOMY  Patient location during evaluation: [facility] Anesthesia Type: General Level of consciousness: awake and alert Pain management: satisfactory to patient Vital Signs Assessment: post-procedure vital signs reviewed and stable Respiratory status: spontaneous breathing and respiratory function stable Cardiovascular status: stable Postop Assessment: adequate PO intake Anesthetic complications: no     Last Vitals:  Vitals:   04/19/16 1515 04/19/16 1602  BP: (!) 148/71 (!) 142/58  Pulse: (!) 121 (!) 129  Resp: (!) 23 (!) 27  Temp: 37.5 C 37.7 C    Last Pain:  Vitals:   04/19/16 1602  TempSrc: Oral  PainSc:    Pain Goal: Patients Stated Pain Goal: 4 (04/19/16 1400)               Gurfateh Mcclain

## 2016-04-19 NOTE — H&P (Signed)
Patient ID: Cathy Bryan, female   DOB: 07-27-1986, 30 y.o.   MRN: [id number] Preoperative History and Physical  Cathy Bryan is a 30 y.o. G0P0000 here for surgical management of endometrioma  Proposed surgery: ovarian cystectomy and possible oophorectomy      Past Medical History:  Diagnosis Date  . Anxiety   . Depression   . Endometriosis   . Hypertension    History reviewed. No pertinent surgical history.         OB History    Gravida Para Term Preterm AB Living   0 0 0 0 0 0   SAB TAB Ectopic Multiple Live Births   0 0 0 0       Patient denies any cervical dysplasia or STIs.        Prescriptions Prior to Admission  Medication Sig Dispense Refill Last Dose  . GARCINIA CAMBOGIA-CHROMIUM PO Take 2 capsules by mouth 2 (two) times daily.   Past Month at Unknown time  . Ibuprofen (IBU PO) Take 3 tablets by mouth every 6 (six) hours as needed (pain).   Past Week at Unknown time  . lisinopril-hydrochlorothiazide (PRINZIDE,ZESTORETIC) 10-12.5 MG tablet Take 1 tablet by mouth daily. 30 tablet 3 04/19/2016 at 0645  . Multiple Vitamin (MULTIVITAMIN WITH MINERALS) TABS tablet Take 1 tablet by mouth daily.   Past Month at Unknown time  . HYDROcodone-acetaminophen (NORCO/VICODIN) 5-325 MG tablet Take 1-2 tablets by mouth every 6 (six) hours as needed for severe pain. (Patient not taking: Reported on 12/15/2015) 20 tablet 0 Not Taking    No Known Allergies Social History:   reports that she has never smoked. She has never used smokeless tobacco. She reports that she drinks about 1.2 oz of alcohol per week . She reports that she uses drugs, including Marijuana.      Family History  Problem Relation Age of Onset  . Hypertension Mother   . Hypertension Father   . Diabetes Father     Review of Systems: Noncontributory  PHYSICAL EXAM: Blood pressure 121/71, pulse 85, temperature 97.8 F (36.6 C), temperature source Oral, resp. rate (!) 22, last menstrual period  04/12/2016, SpO2 100 %. General appearance - alert, well appearing, and in no distress Chest - clear to auscultation, no wheezes, rales or rhonchi, symmetric air entry Heart - normal rate and regular rhythm Abdomen - soft, nontender, nondistended, no masses or organomegaly Pelvic - examination not indicated Extremities - peripheral pulses normal, no pedal edema, no clubbing or cyanosis  Labs:      Results for orders placed or performed in visit on 04/11/16 (from the past 336 hour(s))  Lipid panel   Collection Time: 04/11/16 10:28 AM  Result Value Ref Range   Cholesterol 90 (L) 125 - 200 mg/dL   Triglycerides 58 <150 mg/dL   HDL 41 (L) >=46 mg/dL   Total CHOL/HDL Ratio 2.2 <=5.0 Ratio   VLDL 12 <30 mg/dL   LDL Cholesterol 37 <130 mg/dL  COMPLETE METABOLIC PANEL WITH GFR   Collection Time: 04/11/16 10:28 AM  Result Value Ref Range   Sodium 138 135 - 146 mmol/L   Potassium 4.0 3.5 - 5.3 mmol/L   Chloride 105 98 - 110 mmol/L   CO2 24 20 - 31 mmol/L   Glucose, Bld 97 65 - 99 mg/dL   BUN 11 7 - 25 mg/dL   Creat 0.62 0.50 - 1.10 mg/dL   Total Bilirubin 0.4 0.2 - 1.2 mg/dL   Alkaline Phosphatase 45 33 - 115  U/L   AST 15 10 - 30 U/L   ALT 12 6 - 29 U/L   Total Protein 6.7 6.1 - 8.1 g/dL   Albumin 3.8 3.6 - 5.1 g/dL   Calcium 9.1 8.6 - 10.2 mg/dL   GFR, Est African American >89 >=60 mL/min   GFR, Est Non African American >89 >=60 mL/min    Imaging Studies: 12/09/2015 EXAM: TRANSABDOMINAL AND TRANSVAGINAL ULTRASOUND OF PELVIS  TECHNIQUE: Both transabdominal and transvaginal ultrasound examinations of the pelvis were performed. Transabdominal technique was performed for global imaging of the pelvis including uterus, ovaries, adnexal regions, and pelvic cul-de-sac. It was necessary to proceed with endovaginal exam following the transabdominal exam to visualize the endometrium, myometrium and adnexa.  COMPARISON:  None  FINDINGS: Uterus  Measurements: 10.5 x 3.6 x 4.0 cm. The anteverted anteflexed uterus is mildly enlarged by fibroids as follows:  - right uterine body intramural 3.1 x 3.0 x 3.4 cm fibroid  - posterior uterine body intramural 2.5 x 1.7 x 1.5 cm fibroid  - questionable intramural anterior lower uterine segment 0.8 x 0.6 x 1.2 cm fibroid  Endometrium  Thickness: 1 mm. No endometrial cavity fluid or focal endometrial mass.  Adnexa:  There is a large complex cystic mass superior to the uterine fundus measuring 7.5 x 9.1 x 11.3 cm, which demonstrates multiple complex internal cystic locules separated by thickened internal septations, with heterogeneous internal echoes within the locules and with questionable internal vascularity at the periphery of the mass on color Doppler. Normal ovaries are not visualized on either side on either the transabdominal or transvaginal portions of the study.  Other findings  No abnormal free fluid.  IMPRESSION: 1. Indeterminate large complex 11.3 cm multiloculated cystic mass superior to the uterine fundus. Normal ovaries are not visualized on either side. A primary ovarian malignancy such as a cystadenocarcinoma is the diagnosis of exclusion. Recommend gynecology consultation and correlation with serum tumor markers. Consider further evaluation with MRI pelvis without and with IV contrast. 2. Mildly enlarged myomatous uterus. No endometrial abnormality.  Assessment:     Patient Active Problem List   Diagnosis Date Noted  . Morbid obesity ([place]) 04/11/2016  . Hypertension 12/15/2015  . Endometriosis 12/15/2015  . TOA (tubo-ovarian abscess) 01/03/2014  . Abnormal cytological findings in female genital organs 10/03/2013    Plan: Patient will undergo surgical management with laparotomy with ovarian cystectomy and possible oophorectomy.   The risks of surgery were discussed in detail with the patient including but  not limited to: bleeding which may require transfusion or reoperation; infection which may require antibiotics; injury to surrounding organs which may involve bowel, bladder, ureters ; need for additional procedures including laparoscopy or laparotomy; thromboembolic phenomenon, surgical site problems and other postoperative/anesthesia complications. Likelihood of success in alleviating the patient's condition was discussed. Routine postoperative instructions will be reviewed with the patient and her family in detail after surgery.  The patient concurred with the proposed plan, giving informed written consent for the surgery.  Patient has been NPO since last night she will remain NPO for procedure.  Anesthesia and OR aware.  Preoperative prophylactic antibiotics and SCDs ordered on call to the OR.  To OR when ready.  Joelene Barriere L. [name], M.D., [facility] 04/19/2016 8:07 AM

## 2016-04-19 NOTE — Op Note (Signed)
04/19/2016  10:18 AM  PATIENT:  Cathy Bryan  30 y.o. 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   Delay start of Pharmacological VTE agent (>24hrs) due to surgical blood loss or risk of bleeding: not applicable  I was assisting Dr. Ihor Dow in the gyn surgery. The appendix was noted to be injected, enlarged, and very firm. It was also adherent to the endometrioma in the beginning of the case. The adhesion was separated and after the gyn portion of the case, I proceeded with the appendectomy. The appendix was grasped with a Babcock clamp and elevated. Kelley clamps were used to clamp the mesoappendix. 0 vicryl suture was used to ligate the mesoappendix after it had been cut. The stump of the appendix was ligated with a 0 vicryl suture and then the end was cauterized. Hemostasis was noted. We then proceed with irrigation of the pelvis and eventually closure.

## 2016-04-19 NOTE — Brief Op Note (Signed)
04/19/2016  11:31 AM  PATIENT:  Metro Kung  30 y.o. female  PRE-OPERATIVE DIAGNOSIS:  Right endometrioma and possible removal of right ovary  POST-OPERATIVE DIAGNOSIS:  RIGHT ENDOMETRIOMA  PROCEDURE:  Procedure(s): EXPLORATORY LAPAROTOMY WITH BILATERAL OOPHORECTOMY  LYSIS OF ADHESIONS (Right) APPENDECTOMY  SURGEON:  Surgeon(s) and Role:    * Lavonia Drafts, MD - Primary    Clovia Cuff, MD - Assisting  ANESTHESIA:   general  EBL:  Total I/O In: 2000 [I.V.:2000] Out: 600 [Urine:450; Blood:150]  BLOOD ADMINISTERED:none  DRAINS: none   LOCAL MEDICATIONS USED:  MARCAINE     SPECIMEN:  Source of Specimen:  bilateral ovaries with endometriomas  DISPOSITION OF SPECIMEN:  PATHOLOGY  COUNTS:  YES  TOURNIQUET:  * No tourniquets in log *  DICTATION: .Note written in EPIC  PLAN OF CARE: Admit to inpatient   PATIENT DISPOSITION:  PACU - hemodynamically stable.   Delay start of Pharmacological VTE agent (>24hrs) due to surgical blood loss or risk of bleeding: yes  Complications:  None immediate  Mikka Kissner L. [name], M.D., [facility]

## 2016-04-19 NOTE — Anesthesia Postprocedure Evaluation (Signed)
Anesthesia Post Note  Patient: Itzae Morgon  Procedure(s) Performed: Procedure(s) (LRB): EXPLORATORY LAPAROTOMY WITH BILATERAL OOPHORECTOMY  LYSIS OF ADHESIONS (Right) APPENDECTOMY  Patient location during evaluation: PACU Anesthesia Type: General Level of consciousness: awake and alert Pain management: pain level controlled Vital Signs Assessment: post-procedure vital signs reviewed and stable Respiratory status: spontaneous breathing, nonlabored ventilation, respiratory function stable and patient connected to nasal cannula oxygen Cardiovascular status: blood pressure returned to baseline and stable Postop Assessment: no signs of nausea or vomiting Anesthetic complications: no     Last Vitals:  Vitals:   04/19/16 1100 04/19/16 1130  BP: (!) 152/73 (!) 149/68  Pulse: 90   Resp: 20 20  Temp:      Last Pain:  Vitals:   04/19/16 0741  TempSrc: Oral   Pain Goal: Patients Stated Pain Goal: 4 (04/19/16 0741)               Alani Lacivita DAVID

## 2016-04-19 NOTE — Progress Notes (Signed)
Patient ID: Cathy Bryan, female   DOB: 04/04/1986, 30 y.o.   MRN: [id number] Preoperative History and Physical  Cathy Bryan is a 30 y.o. G0P0000 here for surgical management of endometrioma  Proposed surgery: ovarian cystectomy and possible oophorectomy  Past Medical History:  Diagnosis Date  . Anxiety   . Depression   . Endometriosis   . Hypertension    History reviewed. No pertinent surgical history. OB History    Gravida Para Term Preterm AB Living   0 0 0 0 0 0   SAB TAB Ectopic Multiple Live Births   0 0 0 0       Patient denies any cervical dysplasia or STIs. Prescriptions Prior to Admission  Medication Sig Dispense Refill Last Dose  . GARCINIA CAMBOGIA-CHROMIUM PO Take 2 capsules by mouth 2 (two) times daily.   Past Month at Unknown time  . Ibuprofen (IBU PO) Take 3 tablets by mouth every 6 (six) hours as needed (pain).   Past Week at Unknown time  . lisinopril-hydrochlorothiazide (PRINZIDE,ZESTORETIC) 10-12.5 MG tablet Take 1 tablet by mouth daily. 30 tablet 3 04/19/2016 at 0645  . Multiple Vitamin (MULTIVITAMIN WITH MINERALS) TABS tablet Take 1 tablet by mouth daily.   Past Month at Unknown time  . HYDROcodone-acetaminophen (NORCO/VICODIN) 5-325 MG tablet Take 1-2 tablets by mouth every 6 (six) hours as needed for severe pain. (Patient not taking: Reported on 12/15/2015) 20 tablet 0 Not Taking    No Known Allergies Social History:   reports that she has never smoked. She has never used smokeless tobacco. She reports that she drinks about 1.2 oz of alcohol per week . She reports that she uses drugs, including Marijuana. Family History  Problem Relation Age of Onset  . Hypertension Mother   . Hypertension Father   . Diabetes Father     Review of Systems: Noncontributory  PHYSICAL EXAM: Blood pressure 121/71, pulse 85, temperature 97.8 F (36.6 C), temperature source Oral, resp. rate (!) 22, last menstrual period 04/12/2016, SpO2 100 %. General appearance - alert,  well appearing, and in no distress Chest - clear to auscultation, no wheezes, rales or rhonchi, symmetric air entry Heart - normal rate and regular rhythm Abdomen - soft, nontender, nondistended, no masses or organomegaly Pelvic - examination not indicated Extremities - peripheral pulses normal, no pedal edema, no clubbing or cyanosis  Labs: Results for orders placed or performed in visit on 04/11/16 (from the past 336 hour(s))  Lipid panel   Collection Time: 04/11/16 10:28 AM  Result Value Ref Range   Cholesterol 90 (L) 125 - 200 mg/dL   Triglycerides 58 <150 mg/dL   HDL 41 (L) >=46 mg/dL   Total CHOL/HDL Ratio 2.2 <=5.0 Ratio   VLDL 12 <30 mg/dL   LDL Cholesterol 37 <130 mg/dL  COMPLETE METABOLIC PANEL WITH GFR   Collection Time: 04/11/16 10:28 AM  Result Value Ref Range   Sodium 138 135 - 146 mmol/L   Potassium 4.0 3.5 - 5.3 mmol/L   Chloride 105 98 - 110 mmol/L   CO2 24 20 - 31 mmol/L   Glucose, Bld 97 65 - 99 mg/dL   BUN 11 7 - 25 mg/dL   Creat 0.62 0.50 - 1.10 mg/dL   Total Bilirubin 0.4 0.2 - 1.2 mg/dL   Alkaline Phosphatase 45 33 - 115 U/L   AST 15 10 - 30 U/L   ALT 12 6 - 29 U/L   Total Protein 6.7 6.1 - 8.1 g/dL   Albumin  3.8 3.6 - 5.1 g/dL   Calcium 9.1 8.6 - 10.2 mg/dL   GFR, Est African American >89 >=60 mL/min   GFR, Est Non African American >89 >=60 mL/min    Imaging Studies: 12/09/2015 EXAM: TRANSABDOMINAL AND TRANSVAGINAL ULTRASOUND OF PELVIS  TECHNIQUE: Both transabdominal and transvaginal ultrasound examinations of the pelvis were performed. Transabdominal technique was performed for global imaging of the pelvis including uterus, ovaries, adnexal regions, and pelvic cul-de-sac. It was necessary to proceed with endovaginal exam following the transabdominal exam to visualize the endometrium, myometrium and adnexa.  COMPARISON:  None  FINDINGS: Uterus  Measurements: 10.5 x 3.6 x 4.0 cm. The anteverted anteflexed uterus is mildly enlarged by  fibroids as follows:  - right uterine body intramural 3.1 x 3.0 x 3.4 cm fibroid  - posterior uterine body intramural 2.5 x 1.7 x 1.5 cm fibroid  - questionable intramural anterior lower uterine segment 0.8 x 0.6 x 1.2 cm fibroid  Endometrium  Thickness: 1 mm. No endometrial cavity fluid or focal endometrial mass.  Adnexa:  There is a large complex cystic mass superior to the uterine fundus measuring 7.5 x 9.1 x 11.3 cm, which demonstrates multiple complex internal cystic locules separated by thickened internal septations, with heterogeneous internal echoes within the locules and with questionable internal vascularity at the periphery of the mass on color Doppler. Normal ovaries are not visualized on either side on either the transabdominal or transvaginal portions of the study.  Other findings  No abnormal free fluid.  IMPRESSION: 1. Indeterminate large complex 11.3 cm multiloculated cystic mass superior to the uterine fundus. Normal ovaries are not visualized on either side. A primary ovarian malignancy such as a cystadenocarcinoma is the diagnosis of exclusion. Recommend gynecology consultation and correlation with serum tumor markers. Consider further evaluation with MRI pelvis without and with IV contrast. 2. Mildly enlarged myomatous uterus.  No endometrial abnormality.  Assessment: Patient Active Problem List   Diagnosis Date Noted  . Morbid obesity ([place]) 04/11/2016  . Hypertension 12/15/2015  . Endometriosis 12/15/2015  . TOA (tubo-ovarian abscess) 01/03/2014  . Abnormal cytological findings in female genital organs 10/03/2013    Plan: Patient will undergo surgical management with laparotomy with ovarian cystectomy and possible oophorectomy.   The risks of surgery were discussed in detail with the patient including but not limited to: bleeding which may require transfusion or reoperation; infection which may require antibiotics; injury to  surrounding organs which may involve bowel, bladder, ureters ; need for additional procedures including laparoscopy or laparotomy; thromboembolic phenomenon, surgical site problems and other postoperative/anesthesia complications. Likelihood of success in alleviating the patient's condition was discussed. Routine postoperative instructions will be reviewed with the patient and her family in detail after surgery.  The patient concurred with the proposed plan, giving informed written consent for the surgery.  Patient has been NPO since last night she will remain NPO for procedure.  Anesthesia and OR aware.  Preoperative prophylactic antibiotics and SCDs ordered on call to the OR.  To OR when ready.  Hayslee Casebolt L. [name], M.D., [facility] 04/19/2016 8:07 AM

## 2016-04-19 NOTE — Progress Notes (Signed)
MD came by and spoke to pt about surgery. PT's questions and concerns were answered.

## 2016-04-19 NOTE — Transfer of Care (Signed)
Immediate Anesthesia Transfer of Care Note  Patient: Cathy Bryan  Procedure(s) Performed: Procedure(s): EXPLORATORY LAPAROTOMY WITH BILATERAL OOPHORECTOMY  LYSIS OF ADHESIONS (Right) APPENDECTOMY  Patient Location: PACU  Anesthesia Type:General  Level of Consciousness: awake and sedated  Airway & Oxygen Therapy: Patient Spontanous Breathing and Patient connected to nasal cannula oxygen  Post-op Assessment: Report given to RN and Post -op Vital signs reviewed and stable  Post vital signs: Reviewed and stable  Last Vitals:  Vitals:   04/19/16 0741  BP: 121/71  Pulse: 85  Resp: (!) 22  Temp: 36.6 C    Last Pain:  Vitals:   04/19/16 0741  TempSrc: Oral      Patients Stated Pain Goal: 4 ([id number])  Complications: No apparent anesthesia complications

## 2016-04-19 NOTE — Op Note (Addendum)
04/19/2016  11:31 AM  PATIENT:  Cathy Bryan  30 y.o. female  PRE-OPERATIVE DIAGNOSIS:  Right endometrioma and possible removal of right ovary  POST-OPERATIVE DIAGNOSIS:  RIGHT ENDOMETRIOMA  PROCEDURE:  Procedure(s): EXPLORATORY LAPAROTOMY WITH BILATERAL OOPHORECTOMY  LYSIS OF ADHESIONS (Right) APPENDECTOMY  SURGEON:  Surgeon(s) and Role:    * Lavonia Drafts, MD - Primary    Clovia Cuff, MD - Assisting  ANESTHESIA:   general  EBL:  Total I/O In: 2000 [I.V.:2000] Out: 600 [Urine:450; Blood:150]  BLOOD ADMINISTERED:none  DRAINS: none   LOCAL MEDICATIONS USED:  MARCAINE     SPECIMEN:  Source of Specimen:  bilateral ovaries with endometriomas  DISPOSITION OF SPECIMEN:  PATHOLOGY  COUNTS:  YES  TOURNIQUET:  * No tourniquets in log *  DICTATION: .Note written in EPIC  PLAN OF CARE: Admit to inpatient   PATIENT DISPOSITION:  PACU - hemodynamically stable.   Delay start of Pharmacological VTE agent (>24hrs) due to surgical blood loss or risk of bleeding: yes  Complications:  None immediate  INDICATIONS: The patient is a 30 y.o. G0P0000 with a midline mass and pelvic pain who failed conservative management and desired definitive surgical management. On the preoperative visit, the risks, benefits, indications, and alternatives of the procedure were reviewed with the patient.  On the day of surgery, the risks of surgery were again discussed with the patient including but not limited to: bleeding which may require transfusion or reoperation; infection which may require antibiotics; injury to bowel, bladder, ureters or other surrounding organs; need for additional procedures; thromboembolic phenomenon, incisional problems and other postoperative/anesthesia complications. Written informed consent was obtained.    OPERATIVE FINDINGS: A small uterus with bilateral adnexal masses and adhesions of bowel to adnexa bilaterally.  The adnexa were fused to each other in the  midline. The rectosigmoid colon was densely adherent to the uterus posteriorly totally obliterating the cul de sac.  The uterus was adherent to the anterior abdominal wall as well.  The endometriomas were also adherent to the pelvic sidewalls bilaterally.  The fallopian tubes were dilated and adherent to the endometriomas bilaterally. The appendix was noted and was adherent to the the mass.  This was all consistent with  Stage IV endometriosis.  DESCRIPTION OF PROCEDURE:  The patient received intravenous antibiotics and had sequential compression devices applied to her lower extremities while in the preoperative area.   She was taken to the operating room and placed under general anesthesia without difficulty.The abdomen and perineum were prepped and draped in a sterile manner, and she was placed in a dorsal supine position.  A Foley catheter was inserted into the bladder and attached to constant drainage. After an adequate timeout was performed, a Pfannensteil skin incision was made. This incision was taken down to the fascia using electrocautery with care given to maintain good hemostasis. The fascia was incised in the midline and the fascial incision was then extended bilaterally using electrocautery without difficulty. The fascia was then dissected off the underlying rectus muscles using blunt and sharp dissection. The rectus muscles were split bluntly in the midline and the peritoneum entered sharply without complication. This peritoneal incision was then extended superiorly and inferiorly with care given to prevent bowel or bladder injury.  Attention was then turned to the pelvis. A survey of the patient's pelvis and abdomen revealed the findings as above.  The bowel over the mass including the appendix were gently dissected off of the mass using sharp dissection.  The bowel was  packed away with moist laparotomy sponges. The mass was identified and carefully dissected away from the surrounding organs by  sharp dissection.  The uteroovarian ligaments and the infundibulopelvic ligaments were identified and dissected out and then bilaterally clamped, transected and suture ligated to remove both masses.  Of note, on the left side a small remnant of normal ovarian tissue was noted and left intact.  Excellent hemostasis was noted. 0 vicryl suture was used.  At this point an appendectomy was performed by Dr. Hulan Fray. Please see her op note for further details of this portion of the procedure. The pelvis was irrigated and hemostasis was reconfirmed at all pedicles.  All laparotomy sponges and instruments were removed from the abdomen. The peritoneum was closed with a  0 Vicryl in a large interrupted stitch.  The fascia was also closed in a running fashion with 0 Vicryl suture. The subcutaneous layer was irrigated and injected with 30 ml of 0.5% Marcaine. The skin was closed with a 4-0 Vicryl subcuticular stitch. Sponge, lap, needle, and instrument counts were correct times two. The patient was taken to the recovery area awake, extubated and in stable condition.  Cathy Mezo L. Ihor Dow, MD, [place] Attending [place], [facility]

## 2016-04-20 ENCOUNTER — Encounter (HOSPITAL_COMMUNITY): Payer: Self-pay | Admitting: Obstetrics & Gynecology

## 2016-04-20 LAB — BASIC METABOLIC PANEL
Anion gap: 6 (ref 5–15)
BUN: 8 mg/dL (ref 6–20)
CHLORIDE: 105 mmol/L (ref 101–111)
CO2: 25 mmol/L (ref 22–32)
CREATININE: 0.73 mg/dL (ref 0.44–1.00)
Calcium: 8.3 mg/dL — ABNORMAL LOW (ref 8.9–10.3)
GFR calc Af Amer: >60 mL/min (ref >60)
GFR calc non Af Amer: >60 mL/min (ref >60)
Glucose, Bld: 122 mg/dL — ABNORMAL HIGH (ref 65–99)
POTASSIUM: 4.3 mmol/L (ref 3.5–5.1)
SODIUM: 136 mmol/L (ref 135–145)

## 2016-04-20 MED ORDER — OXYCODONE-ACETAMINOPHEN 5-325 MG PO TABS
1.0000 | ORAL_TABLET | ORAL | Status: DC | PRN
  Administered 2016-04-20: 2 via ORAL
  Administered 2016-04-20: 1 via ORAL
  Administered 2016-04-21 (×2): 2 via ORAL
  Filled 2016-04-20: qty 2
  Filled 2016-04-20: qty 1
  Filled 2016-04-20 (×2): qty 2

## 2016-04-20 MED ORDER — HYDROCHLOROTHIAZIDE 25 MG PO TABS
25.0000 mg | ORAL_TABLET | Freq: Every day | ORAL | Status: DC
  Administered 2016-04-21: 25 mg via ORAL
  Filled 2016-04-20: qty 1

## 2016-04-20 MED ORDER — IBUPROFEN 600 MG PO TABS
600.0000 mg | ORAL_TABLET | Freq: Four times a day (QID) | ORAL | Status: DC
  Administered 2016-04-20 – 2016-04-21 (×2): 600 mg via ORAL
  Filled 2016-04-20 (×2): qty 1

## 2016-04-20 NOTE — Progress Notes (Signed)
1 Day Post-Op Procedure(s) (LRB): EXPLORATORY LAPAROTOMY WITH BILATERAL OOPHORECTOMY  LYSIS OF ADHESIONS (Right) APPENDECTOMY  Subjective: Patient reports good pain control.  No N/V.  Walked last night and this am.  Feels thirsty but, is not hungry.  She denies flatus but, reports feeling like she is about to pass gas.  Foley was just removed.  Objective: I have reviewed patient's vital signs, intake and output, medications, labs and pathology. I/O last 3 completed shifts: In: 2408.3 [I.V.:2408.3] Out: 3875 [Urine:3725; Blood:150] No intake/output data recorded.   General: alert and no distress Resp: clear to auscultation bilaterally Cardio: regular rate and rhythm, S1, S2 normal, no murmur, click, rub or gallop GI: soft, NT, ND.  hypoactive BS Extremities: extremities normal, atraumatic, no cyanosis or edema Vaginal Bleeding: none CBC    Component Value Date/Time   WBC 18.0 (H) 04/19/2016 1327   RBC 4.40 04/19/2016 1327   HGB 12.2 04/19/2016 1327   HCT 34.7 (L) 04/19/2016 1327   PLT  04/19/2016 1327    PLATELET CLUMPING, SUGGEST RECOLLECTION OF SAMPLE IN CITRATE TUBE.   MCV 78.9 04/19/2016 1327   MCV 83.9 09/11/2014 1512   MCH 27.7 04/19/2016 1327   MCHC 35.2 04/19/2016 1327   RDW 14.1 04/19/2016 1327   LYMPHSABS 3.9 12/09/2015 2102   MONOABS 0.9 12/09/2015 2102   EOSABS 0.4 12/09/2015 2102   BASOSABS 0.0 12/09/2015 2102   BMP Latest Ref Rng & Units 04/20/2016 04/11/2016 03/31/2016  Glucose 65 - 99 mg/dL 122(H) 97 97  BUN 6 - 20 mg/dL 8 11 10   Creatinine 0.44 - 1.00 mg/dL 0.73 0.62 0.72  Sodium 135 - 145 mmol/L 136 138 137  Potassium 3.5 - 5.1 mmol/L 4.3 4.0 3.7  Chloride 101 - 111 mmol/L 105 105 106  CO2 22 - 32 mmol/L 25 24 25   Calcium 8.9 - 10.3 mg/dL 8.3(L) 9.1 8.8(L)    Assessment: s/p Procedure(s): EXPLORATORY LAPAROTOMY WITH BILATERAL OOPHORECTOMY  LYSIS OF ADHESIONS (Right) APPENDECTOMY: stable and progressing well  Plan: Encourage ambulation Advance to  PO medication Clear liquids  D/c PCA later today CBC and BMP in am  LOS: 1 day    Cathy Bryan 04/20/2016, 8:57 AM

## 2016-04-20 NOTE — Progress Notes (Signed)
I offered follow-up support to pt who continued to have good support from family and friends in the room with her.  She stated that she is feeling better emotionally today.  She did not wish to talk further at this time.  I gave her my card for follow-up support if needed.  Chaplain Janne Napoleon, Bcc Pager, [phone] 12:52 PM   04/20/16 1200  Clinical Encounter Type  Visited With Patient and family together  Visit Type Follow-up

## 2016-04-21 DIAGNOSIS — N80129 Deep endometriosis of ovary, unspecified ovary: Secondary | ICD-10-CM

## 2016-04-21 DIAGNOSIS — N801 Endometriosis of ovary: Secondary | ICD-10-CM

## 2016-04-21 DIAGNOSIS — N736 Female pelvic peritoneal adhesions (postinfective): Secondary | ICD-10-CM

## 2016-04-21 LAB — BASIC METABOLIC PANEL
Anion gap: 4 — ABNORMAL LOW (ref 5–15)
BUN: 8 mg/dL (ref 6–20)
CALCIUM: 8.3 mg/dL — AB (ref 8.9–10.3)
CO2: 29 mmol/L (ref 22–32)
CREATININE: 0.77 mg/dL (ref 0.44–1.00)
Chloride: 105 mmol/L (ref 101–111)
Glucose, Bld: 101 mg/dL — ABNORMAL HIGH (ref 65–99)
Potassium: 3.9 mmol/L (ref 3.5–5.1)
SODIUM: 138 mmol/L (ref 135–145)

## 2016-04-21 LAB — CBC
HEMATOCRIT: 30.3 % — AB (ref 36.0–46.0)
HEMOGLOBIN: 10.4 g/dL — AB (ref 12.0–15.0)
MCH: 28 pg (ref 26.0–34.0)
MCHC: 34.3 g/dL (ref 30.0–36.0)
MCV: 81.7 fL (ref 78.0–100.0)
PLATELETS: 215 10*3/uL (ref 150–400)
RBC: 3.71 MIL/uL — AB (ref 3.87–5.11)
RDW: 14.6 % (ref 11.5–15.5)
WBC: 11.2 10*3/uL — ABNORMAL HIGH (ref 4.0–10.5)

## 2016-04-21 MED ORDER — FAMOTIDINE 20 MG PO TABS
20.0000 mg | ORAL_TABLET | Freq: Two times a day (BID) | ORAL | Status: DC
  Administered 2016-04-21: 20 mg via ORAL
  Filled 2016-04-21: qty 1

## 2016-04-21 MED ORDER — IBUPROFEN 600 MG PO TABS: 600.0000 mg | ORAL_TABLET | Freq: Four times a day (QID) | ORAL | 0 refills | Status: DC

## 2016-04-21 MED ORDER — OXYCODONE-ACETAMINOPHEN 5-325 MG PO TABS: 1.0000 | ORAL_TABLET | ORAL | 0 refills | Status: DC | PRN

## 2016-04-21 NOTE — Discharge Summary (Signed)
Physician Discharge Summary  Patient ID: Cathy Bryan MRN: [id number] DOB/AGE: 09-Aug-1986 30 y.o.  Admit date: 04/19/2016 Discharge date: 04/21/2016  Admission Diagnoses: pelvic mass; endometrioma Discharge Diagnoses: pelvic mass; bilateral endometriomas Active Problems:   Post-operative state  Discharged Condition: good  Hospital Course: Patient had an uncomplicated surgery; for further details of this surgery, please refer to the operative note. Furthermore, the patient had an uncomplicated postoperative course.  By time of discharge, her pain was controlled on oral pain medications; she was ambulating, voiding without difficulty, tolerating regular diet and passing flatus.  She was deemed stable for discharge to home.  Pt denied vasomotor sx post op  Consults: None  Significant Diagnostic Studies: labs: CBC and BMP   Treatments: surgery: bilateral oophorectomy  Discharge Exam: Blood pressure (!) 115/59, pulse 98, temperature 98.4 F (36.9 C), temperature source Axillary, resp. rate 18, height 5\' 7"  (1.702 m), weight (!) 311 lb (141.1 kg), last menstrual period 04/12/2016, SpO2 100 %.  I/O last 3 completed shifts: In: 1080 [P.O.:1080] Out: 2575 [Urine:2575] No intake/output data recorded.   General appearance: alert and no distress Resp: clear to auscultation bilaterally Cardio: regular rate and rhythm, S1, S2 normal, no murmur, click, rub or gallop GI: soft, non-tender; bowel sounds normal; no masses,  no organomegaly and incision: no active bleeding  Extremities: extremities normal, atraumatic, no cyanosis or edema   CBC Latest Ref Rng & Units 04/21/2016 04/19/2016 04/19/2016  WBC 4.0 - 10.5 K/uL 11.2(H) 18.0(H) 10.9(H)  Hemoglobin 12.0 - 15.0 g/dL 10.4(L) 12.2 12.8  Hematocrit 36.0 - 46.0 % 30.3(L) 34.7(L) 37.9  Platelets 150 - 400 K/uL 215 PLATELET CLUMPING, SUGGEST RECOLLECTION OF SAMPLE IN CITRATE TUBE. 137(L)   BMP Latest Ref Rng & Units 04/21/2016 04/20/2016 04/11/2016   Glucose 65 - 99 mg/dL 101(H) 122(H) 97  BUN 6 - 20 mg/dL 8 8 11   Creatinine 0.44 - 1.00 mg/dL 0.77 0.73 0.62  Sodium 135 - 145 mmol/L 138 136 138  Potassium 3.5 - 5.1 mmol/L 3.9 4.3 4.0  Chloride 101 - 111 mmol/L 105 105 105  CO2 22 - 32 mmol/L 29 25 24   Calcium 8.9 - 10.3 mg/dL 8.3(L) 8.3(L) 9.1    Disposition: 01-Home or Self Care  Discharge Instructions    Call MD for:  difficulty breathing, headache or visual disturbances    Complete by:  As directed   Call MD for:  extreme fatigue    Complete by:  As directed   Call MD for:  hives    Complete by:  As directed   Call MD for:  persistant dizziness or light-headedness    Complete by:  As directed   Call MD for:  persistant nausea and vomiting    Complete by:  As directed   Call MD for:  redness, tenderness, or signs of infection (pain, swelling, redness, odor or green/yellow discharge around incision site)    Complete by:  As directed   Call MD for:  severe uncontrolled pain    Complete by:  As directed   Call MD for:  temperature >100.4    Complete by:  As directed   Diet - low sodium heart healthy    Complete by:  As directed   Driving Restrictions    Complete by:  As directed   No driving for 2 weeks   Increase activity slowly    Complete by:  As directed   Lifting restrictions    Complete by:  As directed   No heavy  lifting for 4 weeks   Sexual Activity Restrictions    Complete by:  As directed   No sexual activity for 4 weeks       Medication List    STOP taking these medications   HYDROcodone-acetaminophen 5-325 MG tablet Commonly known as:  NORCO/VICODIN     TAKE these medications   GARCINIA CAMBOGIA-CHROMIUM PO Take 2 capsules by mouth 2 (two) times daily.   ibuprofen 600 MG tablet Commonly known as:  ADVIL,MOTRIN Take 1 tablet (600 mg total) by mouth 4 (four) times daily. What changed:  medication strength  how much to take  when to take this  reasons to take this   lisinopril-hydrochlorothiazide  10-12.5 MG tablet Commonly known as:  PRINZIDE,ZESTORETIC Take 1 tablet by mouth daily.   multivitamin with minerals Tabs tablet Take 1 tablet by mouth daily.   oxyCODONE-acetaminophen 5-325 MG tablet Commonly known as:  PERCOCET/ROXICET Take 1-2 tablets by mouth every 4 (four) hours as needed for moderate pain or severe pain.      Follow-up Information    [name], Celinda Dethlefs, MD Follow up in 2 week(s).   Specialty:  Obstetrics and Gynecology Contact information: [place] [address] [phone]           Signed: Lavonia Drafts 04/21/2016, 12:02 PM

## 2016-04-21 NOTE — Discharge Instructions (Signed)
Exploratory Laparotomy, Adult, Care After °Refer to this sheet in the next few weeks. These instructions provide you with information about caring for yourself after your procedure. Your health care provider may also give you more specific instructions. Your treatment has been planned according to current medical practices, but problems sometimes occur. Call your health care provider if you have any problems or questions after your procedure. °WHAT TO EXPECT AFTER THE PROCEDURE °After your procedure, it is typical to have: °· Abdominal soreness. °· Fatigue. °· A sore throat from tubes in your throat. °· A lack of appetite. °HOME CARE INSTRUCTIONS °Medicines °· Take medicines only as directed by your health care provider. °· Do not drive or operate heavy machinery while taking pain medicine. °Incision Care °· There are many different ways to close and cover an incision, including stitches (sutures), skin glue, and adhesive strips. Follow your health care provider's instructions about: °¨ Incision care. °¨ Bandage (dressing) changes and removal. °¨ Incision closure removal. °· Do not take showers or baths until your health care provider says that you can. °· Check your incision area daily for signs of infection. Watch for: °¨ Redness. °¨ Tenderness. °¨ Swelling. °¨ Drainage. °Activities °· Do not lift anything that is heavier than 10 pounds (4.5 kg) until your health care provider says that it is safe. °· Try to walk a little bit each day if your health care provider says that it is okay. °· Ask your health care provider when you can start to do your usual activities again, such as driving, going back to work, and having sex. °Eating and Drinking °· You may eat what you usually eat. Include lots of whole grains, fruits, and vegetables in your diet. This will help to prevent constipation. °· Drink enough fluid to keep your urine clear or pale yellow. °General Instructions °· Keep all follow-up visits as directed by  your health care provider. This is important. °SEEK MEDICAL CARE IF:  °· You have a fever. °· You have chills. °· Your pain medicine is not helping. °· You have constipation or diarrhea. °· You have nausea or vomiting. °· You have drainage, redness, swelling, or pain at your incision site. °SEEK IMMEDIATE MEDICAL CARE IF: °· Your pain is getting worse. °· It has been more than 3 days since you been able to have a bowel movement. °· You have ongoing (persistent) vomiting. °· The edges of your incision open up. °· You have warmth, tenderness, and swelling in your calf. °· You have trouble breathing. °· You have chest pain. °  °This information is not intended to replace advice given to you by your health care provider. Make sure you discuss any questions you have with your health care provider. °  °Document Released: 04/12/2004 Document Revised: 09/19/2014 Document Reviewed: 04/16/2014 °Elsevier Interactive Patient Education ©2016 [employer] ° °

## 2016-04-21 NOTE — Progress Notes (Signed)
Pt out in wheelchair  Teaching complete   

## 2016-04-21 NOTE — Progress Notes (Signed)
The patient is receiving famotidine by the intravenous route.  Based on criteria approved by the Pharmacy and Therapeutics Committee and the Medical Executive Committee, the medication is being converted to the equivalent oral dose form.  These criteria include: -No Active GI bleeding -Able to tolerate diet of full liquids (or better) or tube feeding -Able to tolerate other medications by the oral or enteral route  If you have any questions about this conversion, please contact the Pharmacy Department (ext [phone]).  Thank you.  

## 2016-04-25 ENCOUNTER — Telehealth: Payer: Self-pay | Admitting: General Practice

## 2016-04-25 NOTE — Telephone Encounter (Signed)
Patient called into front office stating at times her stomach feels heavy at the bottom especially if she sits for a while. Discussed normal healing process/soreness following surgery. Also discussed using ibuprofen as needed as well & gradually increasing activity. Patient verbalized understanding to all & asked about taking the steri strips off. Told her to leave them alone and they will fall off on their own. Patient verbalized understanding & asked about wound care. Told patient to let the warm soapy water run over her incision, do not rub or scrub the incision and to make sure incision is thoroughly dry. Patient verbalized understanding to all & had no other questions

## 2016-04-27 ENCOUNTER — Ambulatory Visit (INDEPENDENT_AMBULATORY_CARE_PROVIDER_SITE_OTHER): Payer: Self-pay | Admitting: Obstetrics & Gynecology

## 2016-04-27 VITALS — BP 144/91 | HR 86 | Temp 98.6°F

## 2016-04-27 DIAGNOSIS — T8131XA Disruption of external operation (surgical) wound, not elsewhere classified, initial encounter: Secondary | ICD-10-CM

## 2016-04-27 DIAGNOSIS — Z9889 Other specified postprocedural states: Secondary | ICD-10-CM

## 2016-04-27 NOTE — Patient Instructions (Signed)
Return to clinic for any scheduled appointments or for any gynecologic concerns as needed.   

## 2016-04-27 NOTE — Progress Notes (Signed)
   CLINIC ENCOUNTER NOTE  History:  30 y.o. G0P0000 here today for reports of incisional separation s/p recent ELAP, BO on 04/19/2016.  Feels pain around the region, some drainage. She denies any abnormal vaginal discharge, bleeding, pelvic pain, fevers, chills, N/V or other concerns.   Past Medical History:  Diagnosis Date  . Anxiety   . Depression   . Endometriosis   . Hypertension     Past Surgical History:  Procedure Laterality Date  . APPENDECTOMY  04/19/2016   Procedure: APPENDECTOMY;  Surgeon: Lavonia Drafts, MD;  Location: [facility];  Service: Gynecology;;  . LAPAROTOMY Right 04/19/2016   Procedure: EXPLORATORY LAPAROTOMY WITH BILATERAL OOPHORECTOMY  LYSIS OF ADHESIONS;  Surgeon: Lavonia Drafts, MD;  Location: [facility];  Service: Gynecology;  Laterality: Right;   The following portions of the patient's history were reviewed and updated as appropriate: allergies, current medications, past family history, past medical history, past social history, past surgical history and problem list.   Review of Systems:  Pertinent items noted in HPI and remainder of comprehensive ROS otherwise negative.  Objective:  Physical Exam BP (!) 144/91   Pulse 86   Temp 98.6 F (37 C) (Oral)   LMP 04/12/2016 (Exact Date)  CONSTITUTIONAL: Well-developed, well-nourished female in no acute distress.  HENT:  Normocephalic, atraumatic. External right and left ear normal. Oropharynx is clear and moist EYES: Conjunctivae and EOM are normal. Pupils are equal, round, and reactive to light. No scleral icterus.  NECK: Normal range of motion, supple, no masses SKIN: Skin is warm and dry. No rash noted. Not diaphoretic. No erythema. No pallor. NEUROLOGIC: Alert and oriented to person, place, and time. Normal reflexes, muscle tone coordination. No cranial nerve deficit noted. PSYCHIATRIC: Normal mood and affect. Normal behavior. Normal judgment and thought content. CARDIOVASCULAR: Normal heart rate  noted RESPIRATORY: Effort and breath sounds normal, no problems with respiration noted ABDOMEN: Soft, no distention noted.  Superior portion of middle of incision is not to be overhanging the inferior portion with some superficial separation. Silver nitrate applied. Area approximated with steristrips and benzoin. PELVIC: Deferred MUSCULOSKELETAL: Normal range of motion. No edema noted.   Assessment & Plan:  Superficial dehiscence of operation wound, initial encounter Area of superficial dehiscence treated. Will follow up with Dr. Lavonia Drafts on Monday, 05/02/16. Routine preventative health maintenance measures emphasized. Please refer to After Visit Summary for other counseling recommendations.    Verita Schneiders, MD, [place] Attending [place], [facility] for [employer], [place]

## 2016-05-01 ENCOUNTER — Encounter (HOSPITAL_COMMUNITY): Payer: Self-pay

## 2016-05-01 ENCOUNTER — Inpatient Hospital Stay (HOSPITAL_COMMUNITY)
Admission: AD | Admit: 2016-05-01 | Discharge: 2016-05-01 | Disposition: A | Discharge status: Home / Self Care | Payer: Self-pay | Source: Ambulatory Visit | Attending: Obstetrics and Gynecology | Admitting: Obstetrics and Gynecology

## 2016-05-01 DIAGNOSIS — Z9889 Other specified postprocedural states: Secondary | ICD-10-CM | POA: Insufficient documentation

## 2016-05-01 DIAGNOSIS — T8189XA Other complications of procedures, not elsewhere classified, initial encounter: Secondary | ICD-10-CM | POA: Insufficient documentation

## 2016-05-01 DIAGNOSIS — F329 Major depressive disorder, single episode, unspecified: Secondary | ICD-10-CM | POA: Insufficient documentation

## 2016-05-01 DIAGNOSIS — F419 Anxiety disorder, unspecified: Secondary | ICD-10-CM | POA: Insufficient documentation

## 2016-05-01 DIAGNOSIS — I10 Essential (primary) hypertension: Secondary | ICD-10-CM | POA: Insufficient documentation

## 2016-05-01 DIAGNOSIS — Z90722 Acquired absence of ovaries, bilateral: Secondary | ICD-10-CM | POA: Insufficient documentation

## 2016-05-01 DIAGNOSIS — N800 Endometriosis of uterus: Secondary | ICD-10-CM | POA: Insufficient documentation

## 2016-05-01 LAB — CBC WITH DIFFERENTIAL/PLATELET
BASOS PCT: 0 %
Basophils Absolute: 0 10*3/uL (ref 0.0–0.1)
EOS ABS: 0.4 10*3/uL (ref 0.0–0.7)
EOS PCT: 3 %
HEMATOCRIT: 33.7 % — AB (ref 36.0–46.0)
Hemoglobin: 11.6 g/dL — ABNORMAL LOW (ref 12.0–15.0)
Lymphocytes Relative: 35 %
Lymphs Abs: 4.3 10*3/uL — ABNORMAL HIGH (ref 0.7–4.0)
MCH: 27.4 pg (ref 26.0–34.0)
MCHC: 34.4 g/dL (ref 30.0–36.0)
MCV: 79.5 fL (ref 78.0–100.0)
MONO ABS: 0.9 10*3/uL (ref 0.1–1.0)
MONOS PCT: 7 %
Neutro Abs: 6.7 10*3/uL (ref 1.7–7.7)
Neutrophils Relative %: 55 %
PLATELETS: 276 10*3/uL (ref 150–400)
RBC: 4.24 MIL/uL (ref 3.87–5.11)
RDW: 13.9 % (ref 11.5–15.5)
WBC: 12.2 10*3/uL — ABNORMAL HIGH (ref 4.0–10.5)

## 2016-05-01 NOTE — MAU Note (Signed)
Dr. Rip Harbour evaluated patient's wound. Patient and her mom were given instructions on wound care.

## 2016-05-01 NOTE — MAU Note (Signed)
Pt has surgery on August 8th. Pt states the incision came open on Wednesday and she had silver nitrate applied. Pt states she had bleeding from incision twice today and it appears to still be opened. Pt denies fever. Pt states she has also has some yellow drainage from incision that looked like pus.

## 2016-05-01 NOTE — MAU Provider Note (Signed)
Chief Complaint: incision open   First Provider Initiated Contact with Patient 05/01/16 0526      SUBJECTIVE HPI: Cathy Bryan is a 30 y.o. G0P0000 who is POD # 12 following exploratory laproscopy and bilateral oophorectomy who presents to maternity admissions reporting her abdominal incision has opened up. She was seen in the office 2 days ago for similar symptoms by Dr Harolyn Rutherford who put silver nitrate on her incision.  This treatment helped but today there was more drainage and the incision looks more open.  She was worried about infection so she came to MAU. She has not tried any other treatments and nothing seems to make it better or worse. She denies vaginal bleeding, vaginal itching/burning, urinary symptoms, h/a, dizziness, n/v, or fever/chills.     HPI  Past Medical History:  Diagnosis Date  . Anxiety   . Depression   . Endometriosis   . Hypertension    Past Surgical History:  Procedure Laterality Date  . APPENDECTOMY  04/19/2016   Procedure: APPENDECTOMY;  Surgeon: Lavonia Drafts, MD;  Location: [facility];  Service: Gynecology;;  . LAPAROTOMY Right 04/19/2016   Procedure: EXPLORATORY LAPAROTOMY WITH BILATERAL OOPHORECTOMY  LYSIS OF ADHESIONS;  Surgeon: Lavonia Drafts, MD;  Location: [facility];  Service: Gynecology;  Laterality: Right;   Social History   Social History  . Marital status: Single    Spouse name: N/A  . Number of children: N/A  . Years of education: N/A   Occupational History  . Not on file.   Social History Main Topics  . Smoking status: Never Smoker  . Smokeless tobacco: Never Used  . Alcohol use 1.2 oz/week    2 Glasses of wine per week  . Drug use:     Types: Marijuana     Comment: last dose 03/21/2016  . Sexual activity: Yes    Birth control/ protection: None   Other Topics Concern  . Not on file   Social History Narrative  . No narrative on file   No current facility-administered medications on file prior to encounter.     Current Outpatient Prescriptions on File Prior to Encounter  Medication Sig Dispense Refill  . ibuprofen (ADVIL,MOTRIN) 600 MG tablet Take 1 tablet (600 mg total) by mouth 4 (four) times daily. 30 tablet 0  . lisinopril-hydrochlorothiazide (PRINZIDE,ZESTORETIC) 10-12.5 MG tablet Take 1 tablet by mouth daily. 30 tablet 3  . Multiple Vitamin (MULTIVITAMIN WITH MINERALS) TABS tablet Take 1 tablet by mouth daily.    Marland Kitchen oxyCODONE-acetaminophen (PERCOCET/ROXICET) 5-325 MG tablet Take 1-2 tablets by mouth every 4 (four) hours as needed for moderate pain or severe pain. 30 tablet 0  . GARCINIA CAMBOGIA-CHROMIUM PO Take 2 capsules by mouth 2 (two) times daily.     No Known Allergies  ROS:  Review of Systems  Constitutional: Negative for chills, fatigue and fever.  Respiratory: Negative for shortness of breath.   Cardiovascular: Negative for chest pain.  Gastrointestinal: Negative for abdominal pain.  Genitourinary: Negative for difficulty urinating, dysuria, flank pain, pelvic pain, vaginal bleeding, vaginal discharge and vaginal pain.  Skin: Positive for wound.  Neurological: Negative for dizziness and headaches.  Psychiatric/Behavioral: Negative.      I have reviewed patient's Past Medical Hx, Surgical Hx, Family Hx, Social Hx, medications and allergies.   Physical Exam  Patient Vitals for the past 24 hrs:  BP Temp Temp src Pulse Resp SpO2  05/01/16 0501 147/76 98.5 F (36.9 C) Oral 94 17 100 %   Constitutional: Well-developed, well-nourished  female in no acute distress.  Cardiovascular: normal rate Respiratory: normal effort GI: Abd soft, non-tender. Pos BS x 4 MS: Extremities nontender, no edema, normal ROM Neurologic: Alert and oriented x 4.  GU: Neg CVAT. Skin: abdominal incision with shallow dehiscence along 75-80% of incision, mild erythema, no edema or notable exudate   LAB RESULTS Results for orders placed or performed during the hospital encounter of 05/01/16 (from the  past 24 hour(s))  CBC with Differential/Platelet     Status: Abnormal   Collection Time: 05/01/16  5:44 AM  Result Value Ref Range   WBC 12.2 (H) 4.0 - 10.5 K/uL   RBC 4.24 3.87 - 5.11 MIL/uL   Hemoglobin 11.6 (L) 12.0 - 15.0 g/dL   HCT 33.7 (L) 36.0 - 46.0 %   MCV 79.5 78.0 - 100.0 fL   MCH 27.4 26.0 - 34.0 pg   MCHC 34.4 30.0 - 36.0 g/dL   RDW 13.9 11.5 - 15.5 %   Platelets 276 150 - 400 K/uL   Neutrophils Relative % 55 %   Neutro Abs 6.7 1.7 - 7.7 K/uL   Lymphocytes Relative 35 %   Lymphs Abs 4.3 (H) 0.7 - 4.0 K/uL   Monocytes Relative 7 %   Monocytes Absolute 0.9 0.1 - 1.0 K/uL   Eosinophils Relative 3 %   Eosinophils Absolute 0.4 0.0 - 0.7 K/uL   Basophils Relative 0 %   Basophils Absolute 0.0 0.0 - 0.1 K/uL       IMAGING No results found.  MAU Management/MDM: Ordered labs and reviewed results.  Consult Dr Rip Harbour. Dr Rip Harbour to bedside to evaluate incision.     Fatima Blank Certified Nurse-Midwife 05/01/2016  6:15 AM

## 2016-05-01 NOTE — Discharge Instructions (Signed)
°  Per Dr. Rip Harbour clean incision 3 times a day gently with a Q-tip dipped in Hydrogen Peroxide. Use folded 4 x 4 gauge at incision site.  Keep your scheduled appointment tomorrow.

## 2016-05-02 ENCOUNTER — Ambulatory Visit (INDEPENDENT_AMBULATORY_CARE_PROVIDER_SITE_OTHER): Payer: Self-pay | Admitting: Obstetrics & Gynecology

## 2016-05-02 VITALS — BP 150/80 | HR 86 | Ht 67.0 in | Wt 307.5 lb

## 2016-05-02 DIAGNOSIS — N958 Other specified menopausal and perimenopausal disorders: Secondary | ICD-10-CM

## 2016-05-02 DIAGNOSIS — Z9889 Other specified postprocedural states: Secondary | ICD-10-CM

## 2016-05-02 DIAGNOSIS — E8940 Asymptomatic postprocedural ovarian failure: Secondary | ICD-10-CM

## 2016-05-02 DIAGNOSIS — N809 Endometriosis, unspecified: Secondary | ICD-10-CM

## 2016-05-02 MED ORDER — ESTRADIOL 1 MG PO TABS: 2.0000 mg | ORAL_TABLET | Freq: Every day | ORAL | Status: DC

## 2016-05-02 MED ORDER — MEDROXYPROGESTERONE ACETATE 5 MG PO TABS: 5.0000 mg | ORAL_TABLET | Freq: Every day | ORAL | 3 refills | Status: DC

## 2016-05-02 NOTE — Patient Instructions (Signed)
Hormone Therapy At menopause, your body begins making less estrogen and progesterone hormones. This causes the body to stop having menstrual periods. This is because estrogen and progesterone hormones control your periods and menstrual cycle. A lack of estrogen may cause symptoms such as:  Hot flushes (or hot flashes).  Vaginal dryness.  Dry skin.  Loss of sex drive.  Risk of bone loss (osteoporosis). When this happens, you may choose to take hormone therapy to get back the estrogen lost during menopause. When the hormone estrogen is given alone, it is usually referred to as ET (Estrogen Therapy). When the hormone progestin is combined with estrogen, it is generally called HT (Hormone Therapy). This was formerly known as hormone replacement therapy (HRT). Your caregiver can help you make a decision on what will be best for you. The decision to use HT seems to change often as new studies are done. Many studies do not agree on the benefits of hormone replacement therapy. LIKELY BENEFITS OF HT INCLUDE PROTECTION FROM:  Hot Flushes (also called hot flashes) - A hot flush is a sudden feeling of heat that spreads over the face and body. The skin may redden like a blush. It is connected with sweats and sleep disturbance. Women going through menopause may have hot flushes a few times a month or several times per day depending on the woman.  Osteoporosis (bone loss) - Estrogen helps guard against bone loss. After menopause, a woman's bones slowly lose calcium and become weak and brittle. As a result, bones are more likely to break. The hip, wrist, and spine are affected most often. Hormone therapy can help slow bone loss after menopause. Weight bearing exercise and taking calcium with vitamin D also can help prevent bone loss. There are also medications that your caregiver can prescribe that can help prevent osteoporosis.  Vaginal dryness - Loss of estrogen causes changes in the vagina. Its lining may  become thin and dry. These changes can cause pain and bleeding during sexual intercourse. Dryness can also lead to infections. This can cause burning and itching. (Vaginal estrogen treatment can help relieve pain, itching, and dryness.)  Urinary tract infections are more common after menopause because of lack of estrogen. Some women also develop urinary incontinence because of low estrogen levels in the vagina and bladder.  Possible other benefits of estrogen include a positive effect on mood and short-term memory in women. RISKS AND COMPLICATIONS  Using estrogen alone without progesterone causes the lining of the uterus to grow. This increases the risk of lining of the uterus (endometrial) cancer. Your caregiver should give another hormone called progestin if you have a uterus.  Women who take combined (estrogen and progestin) HT appear to have an increased risk of breast cancer. The risk appears to be small, but increases throughout the time that HT is taken.  Combined therapy also makes the breast tissue slightly denser which makes it harder to read mammograms (breast X-rays).  Combined, estrogen and progesterone therapy can be taken together every day, in which case there may be spotting of blood. HT therapy can be taken cyclically in which case you will have menstrual periods. Cyclically means HT is taken for a set amount of days, then not taken, then this process is repeated.  HT may increase the risk of stroke, heart attack, breast cancer and forming blood clots in your leg.  Transdermal estrogen (estrogen that is absorbed through the skin with a patch or a cream) may have better results with:  Cholesterol.  Blood pressure.  Blood clots. Having the following conditions may indicate you should not have HT:  Endometrial cancer.  Liver disease.  Breast cancer.  Heart disease.  History of blood clots.  Stroke. TREATMENT   If you choose to take HT and have a uterus, usually  estrogen and progestin are prescribed.  Your caregiver will help you decide the best way to take the medications.  Possible ways to take estrogen include:  Pills.  Patches.  Gels.  Sprays.  Vaginal estrogen cream, rings and tablets.  It is best to take the lowest dose possible that will help your symptoms and take them for the shortest period of time that you can.  Hormone therapy can help relieve some of the problems (symptoms) that affect women at menopause. Before making a decision about HT, talk to your caregiver about what is best for you. Be well informed and comfortable with your decisions. HOME CARE INSTRUCTIONS   Follow your caregivers advice when taking the medications.  A Pap test is done to screen for cervical cancer.  The first Pap test should be done at age 34.  Between ages 80 and 52, Pap tests are repeated every 2 years.  Beginning at age 13, you are advised to have a Pap test every 3 years as long as the past 3 Pap tests have been normal.  Some women have medical problems that increase the chance of getting cervical cancer. Talk to your caregiver about these problems. It is especially important to talk to your caregiver if a new problem develops soon after your last Pap test. In these cases, your caregiver may recommend more frequent screening and Pap tests.  The above recommendations are the same for women who have or have not gotten the vaccine for HPV (human papillomavirus).  If you had a hysterectomy for a problem that was not a cancer or a condition that could lead to cancer, then you no longer need Pap tests. However, even if you no longer need a Pap test, a regular exam is a good idea to make sure no other problems are starting.  If you are between ages 20 and 60, and you have had normal Pap tests going back 10 years, you no longer need Pap tests. However, even if you no longer need a Pap test, a regular exam is a good idea to make sure no other problems  are starting.  If you have had past treatment for cervical cancer or a condition that could lead to cancer, you need Pap tests and screening for cancer for at least 20 years after your treatment.  If Pap tests have been discontinued, risk factors (such as a new sexual partner)need to be re-assessed to determine if screening should be resumed.  Some women may need screenings more often if they are at high risk for cervical cancer.  Get mammograms done as per the advice of your caregiver. SEEK IMMEDIATE MEDICAL CARE IF:  You develop abnormal vaginal bleeding.  You have pain or swelling in your legs, shortness of breath, or chest pain.  You develop dizziness or headaches.  You have lumps or changes in your breasts or armpits.  You have slurred speech.  You develop weakness or numbness of your arms or legs.  You have pain, burning, or bleeding when urinating.  You develop abdominal pain.   This information is not intended to replace advice given to you by your health care provider. Make sure you discuss any questions  you have with your health care provider.   Document Released: 05/28/2003 Document Revised: 01/13/2015 Document Reviewed: 03/02/2015 Elsevier Interactive Patient Education 2016 [employer]  

## 2016-05-02 NOTE — Progress Notes (Addendum)
History:  30 y.o. G0P0000 here today for 2 weeks post op check. Pt reports hot flushes and reports that they are becoming worse and unbearable.  She reports that she was seen x2 since surgery to have her incision checked.  She denies fever or chills but, reports that she had her incision treated with sliver nitrate prev.  She reports mood changes.     The following portions of the patient's history were reviewed and updated as appropriate: allergies, current medications, past family history, past medical history, past social history, past surgical history and problem list.  Review of Systems:  Pertinent items are noted in HPI.  Objective:  Physical Exam Blood pressure (!) 150/80, pulse 86, height 5\' 7"  (1.702 m), weight (!) 307 lb 8 oz (139.5 kg). Gen: NAD Abd: Soft, nontender and nondistended; obese Incision: clean and dry. There are some areas of separation but, there are no s/sx.   Labs and Imaging 04/18/2016 Diagnosis 1. Appendix, Incidental - BENIGN APPENDIX WITH ENDOMETRIOSIS. 2. Ovaries, bilateral excision, and endometriomas - ENDOMETRIOTIC CYSTS. - HEMORRHAGIC CORPUS LUTEUM. - NO MALIGNANCY.  Assessment & Plan:  2 week post op check.  Doing well.  Sx of surgical menopause noted. I have reviewed with the patient and her mother the extent of her disease and the fact that she may get pregnant with IVF and donor eggs. She does have an intact uterus.  1. Post-operative state Pt is healing well.  Reviewed wound care Reviewed surg path   -2. Surgical menopause Reviewed s/sx with pt  - estradiol (ESTRACE) tablet 2 mg; Take 2 tablets (2 mg total) by mouth daily. - medroxyPROGESTERone (PROVERA) 5 MG tablet; Take 1 tablet (5 mg total) by mouth daily. Use for five consecutive days every month while on estrogen therapy  Dispense: 30 tablet; Refill: 3  3. Endometriosis- Stage IV  Will follow for now. Reviewed that EES may continue to feed the endo but, will watch as sx are not  bearable at present  F/u in 2 weeks to recheck incision and eval wound.    Zandon Talton L. [name], M.D., Cherlynn June

## 2016-05-03 ENCOUNTER — Encounter: Payer: Self-pay | Admitting: Obstetrics & Gynecology

## 2016-05-06 ENCOUNTER — Encounter: Payer: Self-pay | Admitting: Obstetrics & Gynecology

## 2016-05-09 ENCOUNTER — Encounter: Payer: Self-pay | Admitting: Obstetrics & Gynecology

## 2016-05-10 ENCOUNTER — Telehealth: Payer: Self-pay

## 2016-05-10 NOTE — Telephone Encounter (Signed)
LM message to return call in regards to her questions she had via MyChart.  Pt questions were: "I haven't had a cycle yet? And what is estrogen therapy?"

## 2016-05-12 MED ORDER — ESTRADIOL 2 MG PO TABS: 2.0000 mg | ORAL_TABLET | Freq: Every day | ORAL | 3 refills | Status: DC

## 2016-05-12 NOTE — Telephone Encounter (Signed)
Pt returned call and I informed pt that her estrogen therapy is the estrace 2 mg tablet po daily that was prescribed by the provider along with the provera that was be taken in 5 consecutive days.  Pt stated that she only picked up the provera and took that last month starting on the 23rd.  Pt stated that her estrace medication was not at her pharmacy.  I explained to pt that she is to take the estrace 2 mg tablet everyday that is to help with her menopausal symptoms since she has no ovaries.  I also explained to her that when she starts taking the provera that she should still be taking the estrace tablet.  Pt stated understanding with no further questions.  Contacted [name] in regards to clarifying estrace order.  Provider verified that pt Rx should be 2 mg tablet daily.  E-prescribed medication according to provider recommendation.  Pt stated that she will go an pick up today.

## 2016-05-18 ENCOUNTER — Encounter: Payer: Self-pay | Admitting: Obstetrics & Gynecology

## 2016-05-18 ENCOUNTER — Ambulatory Visit (INDEPENDENT_AMBULATORY_CARE_PROVIDER_SITE_OTHER): Payer: Self-pay | Admitting: Clinical

## 2016-05-18 ENCOUNTER — Ambulatory Visit (INDEPENDENT_AMBULATORY_CARE_PROVIDER_SITE_OTHER): Payer: Self-pay | Admitting: Obstetrics & Gynecology

## 2016-05-18 VITALS — BP 128/86 | HR 91 | Ht 67.0 in | Wt 306.0 lb

## 2016-05-18 DIAGNOSIS — F4321 Adjustment disorder with depressed mood: Secondary | ICD-10-CM

## 2016-05-18 DIAGNOSIS — Z9889 Other specified postprocedural states: Secondary | ICD-10-CM

## 2016-05-18 NOTE — Progress Notes (Signed)
History:  30 y.o. G0P0000 here today for 4 week post op check. And HRT eval. Pt reports being on the EES for 1 week now. She reports improved sx.  She reports improvement of her moods. Pt reports no pain and no bleeding.  Took the provera for 5 days before getting the Estrace rx.    The following portions of the patient's history were reviewed and updated as appropriate: allergies, current medications, past family history, past medical history, past social history, past surgical history and problem list.  Review of Systems:  Pertinent items are noted in HPI.  Objective:  Physical Exam Blood pressure 128/86, pulse 91, height 5\' 7"  (1.702 m), weight (!) 306 lb (138.8 kg). Gen: NAD Abd: Soft, nontender and nondistended; incision C/D/I- there are 2 small areas of granulation tissue and silver nitrate was applied     Assessment & Plan:  4 week post op check doing well We rediscussed pt fertility and that she has a uterus so she can get pregnant using a donor egg and IVF.  May gradually return to full activities Estrace 2mg  po q day Provera 5mg  on day s1-5 of the month  F/u in 3 months or sooner prn

## 2016-05-18 NOTE — Progress Notes (Signed)
  ASSESSMENT: Pt currently experiencing Adjustment reaction with depressed mood. Pt would benefit from psychoeducation and brief therapeutic intervention regarding coping with symptoms of depression.   Stage of Change: contemplative  PLAN: 1. F/U with behavioral health clinician as needed 2. Psychiatric Medications: none 3. Behavioral recommendations:   -Continue to keep positive outlook -Read educational material regarding coping with symptoms of depression  SUBJECTIVE: Pt. referred by Lavonia Drafts, MD, for symptoms of depression Pt. reports the following symptoms/concerns: Pt states that she has had to adjust to surgery and recent changes in health; copes by keeping a positive outlook Duration of problem:  Severity: mild   OBJECTIVE: Orientation & Cognition: Oriented x3. Thought processes normal and appropriate to situation. Mood: appropriate Affect: appropriate Appearance: appropriate Risk of harm to self or others: no known risk of harm to self or others Substance use: none Assessments administered: PHQ9: 10/ GAD7: 8  Diagnosis: Adjustment disorder with depressed mood CPT Code: F43.21  -------------------------------------------- Other(s) present in the room: friend  Time spent with patient in exam room: 20 minutes, 11:30-11:50am  Depression screen [facility] 2/9 05/18/2016 04/11/2016 12/15/2015  Decreased Interest 1 1 0  Down, Depressed, Hopeless 2 1 0  PHQ - 2 Score 3 2 0  Altered sleeping 2 2 -  Tired, decreased energy 2 2 -  Change in appetite 1 1 -  Feeling bad or failure about yourself  0 1 -  Trouble concentrating 2 1 -  Moving slowly or fidgety/restless 0 0 -  Suicidal thoughts 0 0 -  PHQ-9 Score 10 9 -   GAD 7 : Generalized Anxiety Score 05/18/2016 04/11/2016  Nervous, Anxious, on Edge 2 1  Control/stop worrying 1 0  Worry too much - different things 1 1  Trouble relaxing 1 0  Restless 1 0  Easily annoyed or irritable 2 1  Afraid - awful might happen 0 1   Total GAD 7 Score 8 4

## 2016-06-08 ENCOUNTER — Encounter (HOSPITAL_BASED_OUTPATIENT_CLINIC_OR_DEPARTMENT_OTHER): Payer: Self-pay | Admitting: Emergency Medicine

## 2016-06-08 ENCOUNTER — Emergency Department (HOSPITAL_BASED_OUTPATIENT_CLINIC_OR_DEPARTMENT_OTHER): Payer: Self-pay

## 2016-06-08 ENCOUNTER — Emergency Department (HOSPITAL_BASED_OUTPATIENT_CLINIC_OR_DEPARTMENT_OTHER)
Admission: EM | Admit: 2016-06-08 | Discharge: 2016-06-08 | Disposition: A | Discharge status: Home / Self Care | Payer: Self-pay | Attending: Emergency Medicine | Admitting: Emergency Medicine

## 2016-06-08 DIAGNOSIS — M25571 Pain in right ankle and joints of right foot: Secondary | ICD-10-CM | POA: Insufficient documentation

## 2016-06-08 DIAGNOSIS — I10 Essential (primary) hypertension: Secondary | ICD-10-CM | POA: Insufficient documentation

## 2016-06-08 DIAGNOSIS — M79671 Pain in right foot: Secondary | ICD-10-CM

## 2016-06-08 DIAGNOSIS — Z79899 Other long term (current) drug therapy: Secondary | ICD-10-CM | POA: Insufficient documentation

## 2016-06-08 MED ORDER — IBUPROFEN 600 MG PO TABS: 600.0000 mg | ORAL_TABLET | Freq: Four times a day (QID) | ORAL | 0 refills | Status: DC | PRN

## 2016-06-08 NOTE — Discharge Instructions (Signed)
Take your medications as prescribed as needed for pain relief. I recommend wearing your new shoes with support/inserts while walking or standing for long periods of time. Continue to rest, elevate and ice your foot for 15 minutes 3-4 times daily to help with your pain.  Follow-up with your primary care provider in the next week if your symptoms have not improved. Please return to the Emergency Department if symptoms worsen or new onset of fever, redness, swelling, warmth, numbness, tingling, weakness.

## 2016-06-08 NOTE — ED Triage Notes (Signed)
Reports pain to the posterior foot, ongoing for months but worsening today. Limping at triage.

## 2016-06-08 NOTE — ED Provider Notes (Signed)
[facility] Provider Note   CSN: [id number] Arrival date & time: 06/08/16  1403     History   Chief Complaint Chief Complaint  Patient presents with  . Foot Pain    HPI Cathy Bryan is a 30 y.o. female.  Pt is a 30 yo female with PMH of HTN who presents to the ED with complaint of right foot pain, onset 2 months. Patient reports over the past 2 months she has had worsening pain to her right foot. 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 She notes she was wearing on shoes that no longer had any support. She states she recently bought new shoes with support and states her pain has improved while she was wearing shoes. Denies any recent fall or injury. She notes she took a dose of an old Percocet she had at home last night without relief. Denies fever, swelling, numbness, tingling, weakness. Denies radiation of pain.       Past Medical History:  Diagnosis Date  . Anxiety   . Depression   . Endometriosis   . Hypertension     Patient Active Problem List   Diagnosis Date Noted  . 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 Post-operative state 04/19/2016  . Morbid obesity ([place]) 04/11/2016  . Hypertension 12/15/2015  . Endometriosis 12/15/2015  . TOA (tubo-ovarian abscess) 01/03/2014  . Abnormal cytological findings in female genital organs 10/03/2013    Past Surgical History:  Procedure Laterality Date  . APPENDECTOMY  04/19/2016   Procedure: APPENDECTOMY;  Surgeon: Lavonia Drafts, MD;  Location: [facility];  Service: Gynecology;;  . LAPAROTOMY Right 04/19/2016   Procedure: EXPLORATORY LAPAROTOMY WITH BILATERAL OOPHORECTOMY  LYSIS OF ADHESIONS;  Surgeon: Lavonia Drafts, MD;  Location: [facility];  Service: Gynecology;  Laterality: Right;    OB History    Gravida Para Term Preterm AB Living   0 0 0 0 0 0   SAB TAB Ectopic Multiple  Live Births   0 0 0 0         Home Medications    Prior to Admission medications   Medication Sig Start Date End Date Taking? Authorizing Provider  estradiol (ESTRACE) 2 MG tablet Take 1 tablet (2 mg total) by mouth daily. 05/12/16  Yes Lavonia Drafts, MD  lisinopril-hydrochlorothiazide (PRINZIDE,ZESTORETIC) 10-12.5 MG tablet Take 1 tablet by mouth daily. 04/11/16  Yes Arnoldo Morale, MD  medroxyPROGESTERone (PROVERA) 5 MG tablet Take 1 tablet (5 mg total) by mouth daily. Use for five consecutive days every month while on estrogen therapy 05/02/16  Yes Lavonia Drafts, MD  Multiple Vitamin (MULTIVITAMIN WITH MINERALS) TABS tablet Take 1 tablet by mouth daily.   Yes Historical Provider, MD  ibuprofen (ADVIL,MOTRIN) 600 MG tablet Take 1 tablet (600 mg total) by mouth every 6 (six) hours as needed. 06/08/16   Nona Dell, PA-C    Family History Family History  Problem Relation Age of Onset  . Hypertension Mother   . Hypertension Father   . Diabetes Father     Social History Social History  Substance Use Topics  . Smoking status: Never Smoker  . Smokeless tobacco: Never Used  . Alcohol use 1.2 oz/week    2 Glasses of wine per week     Allergies   Review of patient's allergies indicates no known allergies.   Review of Systems Review of Systems  Constitutional: Negative for fever.  Musculoskeletal: Positive for arthralgias (left foot). Negative for joint swelling.  Skin: Negative for wound.  Neurological: Negative for weakness and numbness.     Physical Exam Updated Vital Signs BP 139/87 (BP Location: Left Arm)   Pulse 88   Temp 97.5 F (36.4 C) (Oral)   Resp 18   Ht 5\' 7"  (1.702 m)   Wt (!) 139.3 kg   SpO2 100%   BMI 48.08 kg/m   Physical Exam  Constitutional: She is oriented to person, place, and time. She appears well-developed and well-nourished.  HENT:  Head: Normocephalic and atraumatic.  Eyes: Conjunctivae and EOM are normal.  Right eye exhibits no discharge. Left eye exhibits no discharge. No scleral icterus.  Neck: Normal range of motion. Neck supple.  Cardiovascular: Normal rate and intact distal pulses.   Pulmonary/Chest: Effort normal.  Musculoskeletal: Normal range of motion. She exhibits tenderness. She exhibits no deformity.       Right ankle: She exhibits normal range of motion, no swelling, no ecchymosis, no deformity, no laceration and normal pulse. No tenderness. Achilles tendon normal.       Right foot: There is tenderness. There is normal range of motion, no swelling, normal capillary refill, no crepitus, no deformity and no laceration.       Feet:  Mild tenderness over dorsal aspect of right forefoot. No swelling, erythema, warmth noted. Full range of motion of right toes, foot and ankle with 5 out of 5 strength. 2+ DP pulse. Sensation grossly intact. Cap refill less than 2. Patient able to stand and ambulate but reports pain when bearing weight on right foot.  Neurological: She is alert and oriented to person, place, and time.  Skin: Skin is warm and dry. Capillary refill takes less than 2 seconds.  Nursing note and vitals reviewed.    ED Treatments / Results  Labs (all labs ordered are listed, but only abnormal results are displayed) Labs Reviewed - No data to display  EKG  EKG Interpretation None       Radiology Dg Foot Complete Right  Result Date: 06/08/2016 CLINICAL DATA:  Chronic pain dorsally EXAM: RIGHT FOOT COMPLETE - 3+ VIEW COMPARISON:  None. FINDINGS: Frontal, oblique, and lateral views were obtained. There is no fracture or dislocation. Joint spaces appear normal. No erosive change. There is mild soft tissue swelling dorsally. IMPRESSION: Mild soft tissue swelling dorsally. No fracture or dislocation. No evident arthropathy. Electronically Signed   By: Lowella Grip III M.D.   On: 06/08/2016 14:39    Procedures Procedures (including critical care time)  Medications  Ordered in ED Medications - No data to display   Initial Impression / Assessment and Plan / ED Course  I have reviewed the triage vital signs and the nursing notes.  Pertinent labs & imaging results that were available during my care of the patient were reviewed by me and considered in my medical decision making (see chart for details).  Clinical Course    Patient presents with right foot pain that has been present for the past 2 months. Pain is worse after standing or ambulating for long periods of time. 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 VSS. Exam revealed tenderness over dorsal aspect of right forefoot without swelling or ecchymosis. Right foot neurovascularly intact. Right foot x-ray showed mild soft tissue swelling without fx or dislocation. Discussed results and plan for discharge with patient. Plan to discharge patient home with NSAIDs, cryotherapy and RICE protocol. Advised patient  to wear new shoes with foot support/shoe inserts while she is at work. Advised patient to follow up with PCP as needed. Discussed return precautions.  Final Clinical Impressions(s) / ED Diagnoses   Final diagnoses:  Foot pain, right    New Prescriptions New Prescriptions   IBUPROFEN (ADVIL,MOTRIN) 600 MG TABLET    Take 1 tablet (600 mg total) by mouth every 6 (six) hours as needed.     Chesley Noon [place], [place] 06/08/16 [place], MD 06/08/16 [phone]

## 2016-06-23 ENCOUNTER — Ambulatory Visit: Payer: Self-pay | Attending: Family Medicine | Admitting: Physician Assistant

## 2016-06-23 VITALS — BP 133/82 | HR 97 | Temp 98.1°F | Resp 16 | Wt 313.2 lb

## 2016-06-23 DIAGNOSIS — R05 Cough: Secondary | ICD-10-CM | POA: Insufficient documentation

## 2016-06-23 DIAGNOSIS — R059 Cough, unspecified: Secondary | ICD-10-CM

## 2016-06-23 DIAGNOSIS — M25571 Pain in right ankle and joints of right foot: Secondary | ICD-10-CM | POA: Insufficient documentation

## 2016-06-23 DIAGNOSIS — Z79899 Other long term (current) drug therapy: Secondary | ICD-10-CM | POA: Insufficient documentation

## 2016-06-23 DIAGNOSIS — I10 Essential (primary) hypertension: Secondary | ICD-10-CM | POA: Insufficient documentation

## 2016-06-23 MED ORDER — LOSARTAN POTASSIUM-HCTZ 50-12.5 MG PO TABS: 1.0000 | ORAL_TABLET | Freq: Every day | ORAL | 3 refills | Status: DC

## 2016-06-23 NOTE — Progress Notes (Signed)
Patient ID: Disaya Kozloff, female   DOB: 27-Sep-1985, 30 y.o.   MRN: [id number]   Diany Garver, is a 30 y.o. female  [id number]  [id number]  DOB - [birth year]  Subjective:  Chief Complaint and HPI: Kaileena Minaya is a 30 y.o. female here today for ~3 week history of worsening cough.  The cough seems to have started within a few weeks of starting Lisinopril/HCT a about 6 weeks ago and has worsened over the last 3 weeks.  She has "coughing spells" that are intermittent and harsh.  The cough is dry without mucus production.  She denies S/Sx of reflux. She does not feel sick or have cold symptoms.  No CP/SOB  She also c/o 1 month h/o R foot pain.  She had xrays in the ED that were negative. NKI.  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  The pain is across the entire top of her mid-foot and moves locations  ROS:   Constitutional:  No f/c, No night sweats, No unexplained weight loss. EENT:  No vision changes, No blurry vision, No hearing changes. No mouth, throat, or ear problems.  Respiratory: +cough, No SOB Cardiac: No CP, no palpitations GI:  No abd pain, No N/V/D. GU: No Urinary s/sx Musculoskeletal: +foot pain Neuro: No headache, no dizziness, no motor weakness.  Skin: No rash Endocrine:  No polydipsia. No polyuria.  Psych: Denies SI/HI  No problems updated.  ALLERGIES: No Known Allergies  PAST MEDICAL HISTORY: Past Medical History:  Diagnosis Date  . Anxiety   . Depression   . Endometriosis   . Hypertension     MEDICATIONS AT HOME: Prior to Admission medications   Medication Sig Start Date End Date Taking? Authorizing Provider  estradiol (ESTRACE) 2 MG tablet Take 1 tablet (2 mg total) by mouth daily. 05/12/16  Yes Lavonia Drafts, MD  medroxyPROGESTERone (PROVERA) 5 MG tablet Take 1 tablet (5 mg total) by mouth daily. Use for five consecutive days every month while on estrogen therapy 05/02/16  Yes Lavonia Drafts, MD  ibuprofen (ADVIL,MOTRIN) 600 MG tablet Take 1 tablet (600 mg total) by mouth every 6 (six) hours as needed. Patient not taking: Reported on 06/23/2016 06/08/16   Nona Dell, PA-C  losartan-hydrochlorothiazide [facility]) 50-12.5 MG tablet Take 1 tablet by mouth daily. 06/23/16   Argentina Donovan, PA-C  Multiple Vitamin (MULTIVITAMIN WITH MINERALS) TABS tablet Take 1 tablet by mouth daily.    Historical Provider, MD     Objective:  EXAM:   Vitals:   06/23/16 1541  BP: 133/82  Pulse: 97  Resp: 16  Temp: 98.1 F (36.7 C)  TempSrc: Oral  SpO2: 98%  Weight: (!) 313 lb 3.2 oz (142.1 kg)    General appearance : A&OX3. NAD. Non-toxic-appearing HEENT: Atraumatic and Normocephalic.  PERRLA. EOM intact.  TM clear B. Mouth-MMM, post pharynx WNL w/o erythema, No PND. Neck: supple, no JVD. No cervical lymphadenopathy. No thyromegaly Chest/Lungs:  Breathing-non-labored, Good air entry bilaterally, breath sounds normal without rales, rhonchi, or wheezing  CVS: S1 S2 regular, no murmurs, gallops, rubs  Extremities: Bilateral Lower Ext shows no edema, both legs are warm to touch with = pulse throughout.  R foot-no point/loccalized TTP.  No obvious defect.  Neurology:  CN II-XII grossly intact, Non focal.   Psych:  TP linear. J/I WNL. Normal speech. Appropriate eye contact and affect.  Skin:  No Rash  Data Review Lab Results  Component  Value Date   HGBA1C 5.7 07/15/2014     Assessment & Plan   1. Essential hypertension Stop lisinopril bc likely cause of cough - losartan-hydrochlorothiazide (HYZAAR) 50-12.5 MG tablet; Take 1 tablet by mouth daily.  Dispense: 90 tablet; Refill: 3 Check BP daily, record, and bring to next visit  2. Pain in joint involving right ankle and foot Likely secondary to poor foot support.  No abnormality on exam or on xray 06/08/2016 month ago - Ambulatory referral to Podiatry  3. Cough Likely secondary to Lisinopril.  Stop  Lisinopril/HCT.  Patient have been counseled extensively about nutrition and exercise  Return in about 1 week (around 06/30/2016) for f/up with Dr. Jarold Song; cough and BP.  The patient was given clear instructions to go to ER or return to medical center if symptoms don't improve, worsen or new problems develop. The patient verbalized understanding. The patient was told to call to get lab results if they haven't heard anything in the next week.     Freeman Caldron, PA-C [facility] and Thomas Shirley, [place]   06/23/2016, 4:09 PM

## 2016-06-23 NOTE — Patient Instructions (Signed)
Check blood pressure daily, record, and bring to next visit

## 2016-06-23 NOTE — Progress Notes (Signed)
Pt is in the office today for a cough Pt states her cough has been going on for a week and half Pt states her cough wakes her up during the night Pt states when she coughs it seem like she is choking Pt states she is not a smoker

## 2016-07-04 ENCOUNTER — Ambulatory Visit (INDEPENDENT_AMBULATORY_CARE_PROVIDER_SITE_OTHER): Payer: Self-pay | Admitting: Podiatry

## 2016-07-04 ENCOUNTER — Ambulatory Visit (INDEPENDENT_AMBULATORY_CARE_PROVIDER_SITE_OTHER): Payer: Self-pay

## 2016-07-04 ENCOUNTER — Other Ambulatory Visit: Payer: Self-pay | Admitting: *Deleted

## 2016-07-04 ENCOUNTER — Encounter: Payer: Self-pay | Admitting: Podiatry

## 2016-07-04 VITALS — BP 121/78 | HR 85 | Resp 16

## 2016-07-04 DIAGNOSIS — M779 Enthesopathy, unspecified: Principal | ICD-10-CM

## 2016-07-04 DIAGNOSIS — M79671 Pain in right foot: Secondary | ICD-10-CM

## 2016-07-04 DIAGNOSIS — M7751 Other enthesopathy of right foot: Secondary | ICD-10-CM

## 2016-07-04 DIAGNOSIS — M25571 Pain in right ankle and joints of right foot: Secondary | ICD-10-CM

## 2016-07-04 DIAGNOSIS — M778 Other enthesopathies, not elsewhere classified: Secondary | ICD-10-CM

## 2016-07-04 DIAGNOSIS — M659 Synovitis and tenosynovitis, unspecified: Secondary | ICD-10-CM

## 2016-07-04 NOTE — Progress Notes (Signed)
   Subjective:    Patient ID: Cathy Bryan, female    DOB: 08-04-1986, 30 y.o.   MRN: [id number]  HPI    Review of Systems  HENT: Positive for sinus pressure.   Respiratory: Positive for cough.   Musculoskeletal: Positive for back pain, gait problem and myalgias.  All other systems reviewed and are negative.      Objective:   Physical Exam        Assessment & Plan:

## 2016-07-06 ENCOUNTER — Ambulatory Visit: Payer: Self-pay | Admitting: Family Medicine

## 2016-07-08 ENCOUNTER — Telehealth: Payer: Self-pay | Admitting: *Deleted

## 2016-07-08 NOTE — Telephone Encounter (Signed)
Received notice that pt's insurance will not cover Duexis 800/26.6mg .

## 2016-07-18 MED ORDER — BETAMETHASONE SOD PHOS & ACET 6 (3-3) MG/ML IJ SUSP: 3.0000 mg | Freq: Once | INTRAMUSCULAR | Status: DC

## 2016-07-18 NOTE — Progress Notes (Signed)
Patient ID: Cathy Bryan, female   DOB: Jul 08, 1986, 30 y.o.   MRN: [id number] Subjective:  Patient presents today for pain and tenderness to the right ankle. Patient relates significant pain and tenderness when walking.  Patient also complains of painful feet especially on the right. Patient states that it hurts when she is walking. Patient states that the pain is to the midfoot.  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 Patient presents for further treatment and evaluation.    Objective / Physical Exam:  General:  The patient is alert and oriented x3 in no acute distress. Dermatology:  Skin is warm, dry and supple bilateral lower extremities. Negative for open lesions or macerations. Vascular:  Palpable pedal pulses bilaterally. No edema or erythema noted. Capillary refill within normal limits. Neurological:  Epicritic and protective threshold grossly intact bilaterally.  Musculoskeletal Exam:  Pain on palpation to the anterior lateral medial aspects of the patient's left ankle. Mild edema noted. Patient also has pain on palpation to the dorsal midfoot/Lisfranc joint of the right lower extremity.  Range of motion within normal limits to all pedal and ankle joints bilateral. Muscle strength 5/5 in all groups bilateral.   Radiographic Exam:  Normal osseous mineralization. Joint spaces preserved. No fracture/dislocation/boney destruction.    Assessment: #1 pain in right ankle and midfoot #2 synovitis of right ankle #3 capsulitis of right ankle and midfoot   Plan of Care:  #1 Patient was evaluated. #2 injection of 0.5 mL Celestone Soluspan injected in the patient's right ankle. #3 injection 0.5 mL Celestone Soluspan injected in the patient's right Lisfranc joint  #4 prescription for anti-inflammatory pain cream dispensed through [facility]  #5 prescription for Duexis #6 ankle brace dispensed #7 patient is to  return to clinic in 4 weeks   Dr. Edrick Kins, [place]

## 2016-08-01 ENCOUNTER — Emergency Department (HOSPITAL_BASED_OUTPATIENT_CLINIC_OR_DEPARTMENT_OTHER)
Admission: EM | Admit: 2016-08-01 | Discharge: 2016-08-01 | Disposition: A | Discharge status: Home / Self Care | Payer: Self-pay | Attending: Emergency Medicine | Admitting: Emergency Medicine

## 2016-08-01 ENCOUNTER — Emergency Department (HOSPITAL_BASED_OUTPATIENT_CLINIC_OR_DEPARTMENT_OTHER): Payer: Self-pay

## 2016-08-01 ENCOUNTER — Encounter (HOSPITAL_BASED_OUTPATIENT_CLINIC_OR_DEPARTMENT_OTHER): Payer: Self-pay | Admitting: *Deleted

## 2016-08-01 DIAGNOSIS — B9689 Other specified bacterial agents as the cause of diseases classified elsewhere: Secondary | ICD-10-CM

## 2016-08-01 DIAGNOSIS — B373 Candidiasis of vulva and vagina: Secondary | ICD-10-CM | POA: Insufficient documentation

## 2016-08-01 DIAGNOSIS — R05 Cough: Secondary | ICD-10-CM | POA: Insufficient documentation

## 2016-08-01 DIAGNOSIS — I10 Essential (primary) hypertension: Secondary | ICD-10-CM | POA: Insufficient documentation

## 2016-08-01 DIAGNOSIS — B3731 Acute candidiasis of vulva and vagina: Secondary | ICD-10-CM

## 2016-08-01 DIAGNOSIS — Z79899 Other long term (current) drug therapy: Secondary | ICD-10-CM | POA: Insufficient documentation

## 2016-08-01 DIAGNOSIS — R059 Cough, unspecified: Secondary | ICD-10-CM

## 2016-08-01 DIAGNOSIS — N760 Acute vaginitis: Secondary | ICD-10-CM | POA: Insufficient documentation

## 2016-08-01 LAB — WET PREP, GENITAL
Sperm: NONE SEEN
TRICH WET PREP: NONE SEEN

## 2016-08-01 LAB — URINALYSIS, ROUTINE W REFLEX MICROSCOPIC
BILIRUBIN URINE: NEGATIVE
GLUCOSE, UA: NEGATIVE mg/dL
HGB URINE DIPSTICK: NEGATIVE
Ketones, ur: NEGATIVE mg/dL
Leukocytes, UA: NEGATIVE
Nitrite: NEGATIVE
Specific Gravity, Urine: 1.031 — ABNORMAL HIGH (ref 1.005–1.030)
pH: 6 (ref 5.0–8.0)

## 2016-08-01 LAB — URINE MICROSCOPIC-ADD ON

## 2016-08-01 LAB — PREGNANCY, URINE: PREG TEST UR: NEGATIVE

## 2016-08-01 MED ORDER — FLUCONAZOLE 50 MG PO TABS
150.0000 mg | ORAL_TABLET | Freq: Once | ORAL | Status: AC
  Administered 2016-08-01: 150 mg via ORAL
  Filled 2016-08-01: qty 1

## 2016-08-01 MED ORDER — FLUTICASONE PROPIONATE 50 MCG/ACT NA SUSP: 1.0000 | Freq: Every day | NASAL | 0 refills | Status: DC

## 2016-08-01 MED ORDER — METRONIDAZOLE 500 MG PO TABS: 500.0000 mg | ORAL_TABLET | Freq: Two times a day (BID) | ORAL | 0 refills | Status: DC

## 2016-08-01 MED ORDER — BENZONATATE 100 MG PO CAPS: 100.0000 mg | ORAL_CAPSULE | Freq: Every evening | ORAL | 0 refills | Status: DC | PRN

## 2016-08-01 MED FILL — metroNIDAZOLE 500 MG TABS: 500 | 7 days supply | Qty: 14 | Fill #0

## 2016-08-01 MED FILL — SM ALLERGY RELIEF 50 MCG SP: 50 MCG | 30 days supply | Qty: 16 | Fill #0

## 2016-08-01 MED FILL — BENZONATATE 100 MG CAPSULE: 100 | 21 days supply | Qty: 21 | Fill #0

## 2016-08-01 NOTE — ED Triage Notes (Signed)
Pt reports vaginal pain and irritation x 3-4 days. Cough and post nasal drip x 1-2 weeks

## 2016-08-01 NOTE — ED Provider Notes (Signed)
[place] [facility] DEPT [facility] Provider Note   CSN: [id number] Arrival date & time: 08/01/16  [id number]     History   Chief Complaint Chief Complaint  Patient presents with  . Vaginal Pain  . Cough    HPI Cathy Bryan is a G80 P0 30 y.o. female with a past medical history of hypertension, endometriosis, pelvic peritoneal lesions,Tubo-ovarian abscess S/P total left oophorectomy and partial right oophorectomy who presents to the emergency department with a few days of constant burning vaginal irritation. The pain is made worse with wiping when she uses the restroom and she has been experiencing some relief with urinating. She has tried "diaper rash cream" with no relief. She denies vaginal bleeding, vaginal discharge, pruritus, or dysuria. LMP July 18, 2016, . She also complains of a productive cough which is worse at night. She is experiencing posttussive production of thick mucus and occasionally vomiting. She has also noticed some pink colored streaking in this mucus. She explains that it seems to be coming from postnasal drip and not from her chest. She denies chest pain or congestion, shortness of breath, fever, chills, nausea, history of DVT/PE, recent surgery, or prolonged immobilization.  HPI  Past Medical History:  Diagnosis Date  . Anxiety   . Depression   . Endometriosis   . Hypertension     Patient Active Problem List   Diagnosis Date Noted  . 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 Post-operative state 04/19/2016  . Morbid obesity ([place]) 04/11/2016  . Hypertension 12/15/2015  . Endometriosis 12/15/2015  . TOA (tubo-ovarian abscess) 01/03/2014  . Abnormal cytological findings in female genital organs 10/03/2013    Past Surgical History:  Procedure Laterality Date  . APPENDECTOMY  04/19/2016   Procedure: APPENDECTOMY;  Surgeon: Lavonia Drafts, MD;  Location: [facility];  Service: Gynecology;;  . LAPAROTOMY Right 04/19/2016   Procedure: EXPLORATORY LAPAROTOMY WITH BILATERAL OOPHORECTOMY  LYSIS OF ADHESIONS;  Surgeon: Lavonia Drafts, MD;  Location: [facility];  Service: Gynecology;  Laterality: Right;    OB History    Gravida Para Term Preterm AB Living   0 0 0 0 0 0   SAB TAB Ectopic Multiple Live Births   0 0 0 0         Home Medications    Prior to Admission medications   Medication Sig Start Date End Date Taking? Authorizing Provider  estradiol (ESTRACE) 2 MG tablet Take 1 tablet (2 mg total) by mouth daily. 05/12/16  Yes Lavonia Drafts, MD  lisinopril-hydrochlorothiazide (PRINZIDE,ZESTORETIC) 20-12.5 MG tablet Take 1 tablet by mouth daily.   Yes Historical Provider, MD  benzonatate (TESSALON) 100 MG capsule Take 1 capsule (100 mg total) by mouth at bedtime as needed for cough. 08/01/16   Emeline General, PA-C  fluticasone (FLONASE) 50 MCG/ACT nasal spray Place 1 spray into both nostrils daily. 08/01/16   Emeline General, PA-C  metroNIDAZOLE (FLAGYL) 500 MG tablet Take 1 tablet (500 mg total) by mouth 2 (two) times daily. 08/01/16   Emeline General, PA-C  Multiple Vitamin (MULTIVITAMIN WITH MINERALS) TABS tablet Take 1 tablet by mouth daily.    Historical Provider, MD    Family History Family History  Problem Relation Age of Onset  . Hypertension Mother   . Hypertension Father   . Diabetes Father     Social History Social History  Substance Use Topics  . Smoking status: Never Smoker  . Smokeless tobacco: Never Used  . Alcohol use 1.2  oz/week    2 Glasses of wine per week     Allergies   Patient has no known allergies.   Review of Systems Review of Systems  Constitutional: Negative for activity change, appetite change, chills, diaphoresis, fatigue, fever and unexpected weight change.  HENT: Positive for congestion and postnasal drip. Negative for ear discharge, ear pain, facial swelling, sinus pain, sinus pressure, sore throat and voice change.   Eyes: Negative for  pain, discharge, redness and itching.  Respiratory: Positive for cough. Negative for choking, chest tightness, shortness of breath, wheezing and stridor.        Patient is coughing mostly at night and has notice mucus production with pink steak. She does not think it is coming from her chest but rather from postnasal drip. She does not report chest congestion.  Cardiovascular: Negative for chest pain, palpitations and leg swelling.  Gastrointestinal: Positive for vomiting. Negative for abdominal distention, abdominal pain, anal bleeding, blood in stool, diarrhea, nausea and rectal pain.       Patient reports posttussive emesis when on a full stomach. Otherwise no nausea/vomiting  Genitourinary: Positive for vaginal pain. Negative for difficulty urinating, dysuria, flank pain, frequency, genital sores, hematuria, pelvic pain, urgency, vaginal bleeding and vaginal discharge.       Reports vaginal irritation as a burning sensation  Musculoskeletal: Negative for back pain, gait problem, joint swelling, myalgias, neck pain and neck stiffness.  Skin: Negative for color change, pallor, rash and wound.  Neurological: Negative for dizziness, syncope, weakness, light-headedness and headaches.     Physical Exam Updated Vital Signs BP 126/99 (BP Location: Right Arm)   Pulse 83   Temp 98 F (36.7 C) (Oral)   Resp 16   Ht 5\' 7"  (1.702 m)   Wt (!) 140.6 kg   LMP 07/19/2016   SpO2 100%   BMI 48.54 kg/m   Physical Exam  Constitutional: She is oriented to person, place, and time. She appears well-developed and well-nourished.  Patient is nontoxic appearing and lying comfortably in bed  HENT:  Head: Normocephalic and atraumatic.  Eyes: Right eye exhibits no discharge. Left eye exhibits no discharge.  Neck: Normal range of motion. Neck supple. No JVD present.  Cardiovascular: Normal rate, regular rhythm and normal heart sounds.  Exam reveals no gallop.   No murmur heard. Pulmonary/Chest: Effort  normal and breath sounds normal. No stridor. No respiratory distress. She has no wheezes. She has no rales. She exhibits no tenderness.  Abdominal: Soft. Bowel sounds are normal. She exhibits no distension and no mass. There is no tenderness. There is no rebound and no guarding. No hernia.  Genitourinary: Vaginal discharge found.  Genitourinary Comments: Chaperon present in the room Patient had erythema on the labia majora. No ulceration. Cervix visualized and non-erythematous. White thin discharge noted around the cervix and vaginal vault.  Musculoskeletal: Normal range of motion. She exhibits no edema or deformity.  Neurological: She is alert and oriented to person, place, and time.  Skin: Skin is warm and dry. No rash noted. No erythema. No pallor.  Psychiatric: She has a normal mood and affect. Her behavior is normal.  Nursing note and vitals reviewed.    ED Treatments / Results  Labs (all labs ordered are listed, but only abnormal results are displayed) Labs Reviewed  WET PREP, GENITAL - Abnormal; Notable for the following:       Result Value   Yeast Wet Prep HPF POC PRESENT (*)    Clue Cells Wet Prep  HPF POC PRESENT (*)    WBC, Wet Prep HPF POC MANY (*)    All other components within normal limits  URINALYSIS, ROUTINE W REFLEX MICROSCOPIC (NOT AT [facility]) - Abnormal; Notable for the following:    Color, Urine AMBER (*)    APPearance CLOUDY (*)    Specific Gravity, Urine 1.031 (*)    Protein, ur >300 (*)    All other components within normal limits  URINE MICROSCOPIC-ADD ON - Abnormal; Notable for the following:    Squamous Epithelial / LPF 6-30 (*)    Bacteria, UA MANY (*)    Casts HYALINE CASTS (*)    All other components within normal limits  PREGNANCY, URINE  RPR  HIV ANTIBODY (ROUTINE TESTING)  GC/CHLAMYDIA PROBE AMP () NOT AT [facility]    EKG  EKG Interpretation None       Radiology Dg Chest 2 View  Result Date: 08/01/2016 CLINICAL DATA:  Cough for 2  weeks.  Nonsmoker. EXAM: CHEST  2 VIEW COMPARISON:  Radiographs and CT 07/06/2014. FINDINGS: Lesser degree of inspiration, especially on the frontal examination. Allowing for this, the heart size and mediastinal contours are stable. There is mild dependent atelectasis at both lung bases. No edema, confluent airspace opacity, pleural effusion or pneumothorax demonstrated. The bones appear unremarkable. IMPRESSION: Suboptimal inspiration.  No acute cardiopulmonary process. Electronically Signed   By: Richardean Sale M.D.   On: 08/01/2016 10:13    Procedures Procedures (including critical care time)  Medications Ordered in ED Medications  fluconazole (DIFLUCAN) tablet 150 mg (150 mg Oral Given 08/01/16 1140)     Initial Impression / Assessment and Plan / ED Course  I have reviewed the triage vital signs and the nursing notes.  Pertinent labs & imaging results that were available during my care of the patient were reviewed by me and considered in my medical decision making (see chart for details).  Clinical Course    30 year old G25 female with a past medical history of tubo-ovarian abscess, endometriosis, status post left total and right partial oophorectomy presenting to the emergency department with a few days of vaginal irritation and 2 weeks of cough.  Obtained a wet prep which showed clue cells and yeast and bacterial vaginosis and yeast vaginitis are consistent with patient's presentation. Patient also had generalized tenderness in vagina. She was sent home with metronidazole and advised to avoid alcohol while on this medication and was treated with one dose diflucan while in the ED.  Patient is otherwise healthy, she is afebrile, denied shortness of breath, chest pain, lower extremity swelling and x-ray was negative for cardiopulmonary process or infiltrate which lowers suspicion for pneumonia or PE.  Discussed with patient strict return precautions. Patient was advised to return to the  emergency department if experiencing any worsening of symptoms including but not limited to, fever, chest pain, shortness of breath, nausea/vomiting.  She was also seen by supervising PA, Clayton Bibles who also assessed the patient and agrees with diagnosis and treatment plan.  Final Clinical Impressions(s) / ED Diagnoses   Final diagnoses:  Bacterial vaginosis  Yeast vaginitis  Cough    New Prescriptions Discharge Medication List as of 08/01/2016 11:33 AM    START taking these medications   Details  benzonatate (TESSALON) 100 MG capsule Take 1 capsule (100 mg total) by mouth at bedtime as needed for cough., Starting Mon 08/01/2016, Print    fluticasone (FLONASE) 50 MCG/ACT nasal spray Place 1 spray into both nostrils daily., Starting Mon  08/01/2016, Print    metroNIDAZOLE (FLAGYL) 500 MG tablet Take 1 tablet (500 mg total) by mouth 2 (two) times daily., Starting Mon 08/01/2016, Print         Emeline General, PA-C 08/01/16 1811    Drenda Freeze, MD 08/02/16 [id number]

## 2016-08-02 LAB — GC/CHLAMYDIA PROBE AMP (~~LOC~~) NOT AT ARMC
Chlamydia: NEGATIVE
Neisseria Gonorrhea: NEGATIVE

## 2016-08-02 LAB — RPR: RPR Ser Ql: NONREACTIVE

## 2016-08-02 LAB — HIV ANTIBODY (ROUTINE TESTING W REFLEX): HIV SCREEN 4TH GENERATION: NONREACTIVE

## 2016-09-08 ENCOUNTER — Ambulatory Visit: Payer: Self-pay | Attending: Family Medicine | Admitting: Family Medicine

## 2016-09-08 ENCOUNTER — Encounter: Payer: Self-pay | Admitting: Family Medicine

## 2016-09-08 VITALS — BP 134/76 | HR 88 | Temp 98.6°F | Ht 67.0 in | Wt 314.2 lb

## 2016-09-08 DIAGNOSIS — Z91018 Allergy to other foods: Secondary | ICD-10-CM | POA: Insufficient documentation

## 2016-09-08 DIAGNOSIS — F419 Anxiety disorder, unspecified: Secondary | ICD-10-CM | POA: Insufficient documentation

## 2016-09-08 DIAGNOSIS — Z9889 Other specified postprocedural states: Secondary | ICD-10-CM | POA: Insufficient documentation

## 2016-09-08 DIAGNOSIS — R059 Cough, unspecified: Secondary | ICD-10-CM

## 2016-09-08 DIAGNOSIS — R05 Cough: Secondary | ICD-10-CM | POA: Insufficient documentation

## 2016-09-08 DIAGNOSIS — F329 Major depressive disorder, single episode, unspecified: Secondary | ICD-10-CM | POA: Insufficient documentation

## 2016-09-08 DIAGNOSIS — I10 Essential (primary) hypertension: Secondary | ICD-10-CM | POA: Insufficient documentation

## 2016-09-08 DIAGNOSIS — Z79899 Other long term (current) drug therapy: Secondary | ICD-10-CM | POA: Insufficient documentation

## 2016-09-08 MED ORDER — LOSARTAN POTASSIUM-HCTZ 100-25 MG PO TABS: 1.0000 | ORAL_TABLET | Freq: Every day | ORAL | 3 refills | Status: DC

## 2016-09-08 MED ORDER — BENZONATATE 100 MG PO CAPS: 100.0000 mg | ORAL_CAPSULE | Freq: Three times a day (TID) | ORAL | 0 refills | Status: DC | PRN

## 2016-09-08 MED ORDER — CETIRIZINE HCL 10 MG PO TABS: 10.0000 mg | ORAL_TABLET | Freq: Every day | ORAL | 3 refills | Status: DC

## 2016-09-08 MED FILL — BENZONATATE 100 MG CAPSULE: 100 | 7 days supply | Qty: 21 | Fill #0

## 2016-09-08 MED FILL — ?CETIRIZINE HCL 10 MG TABLE: 10 | 30 days supply | Qty: 30 | Fill #0

## 2016-09-08 MED FILL — LOSARTAN-HCTZ 100-25 MG TAB: 100-25 | 30 days supply | Qty: 30 | Fill #0

## 2016-09-08 NOTE — Progress Notes (Signed)
Still taking the lisinopril- other med prescribed was too expensive Coughs so hard that she vomits

## 2016-09-08 NOTE — Progress Notes (Signed)
Subjective:  Patient ID: Cathy Bryan, female    DOB: 02-Nov-1985  Age: 30 y.o. MRN: [id number]  CC: Hypertension and Cough (productive- clear)   HPI Cathy Bryan is a 30 year old female with a history of hypertension who presents today complaining of cough which has been present for the last few months. Cough is mostly dry but sometimes productive of whitish phlegm. At her last visit with a PA, lisinopril/HCTZ was substituted with losartan/HCTZ however the patient never picked up the latter due to financial constraints and has still been taking lisinopril. She denied shortness of breath, chest pain, sinus pressure or postnasal drip.  Also is concerned of a possible allergy to tomatoes or shellfish and would like to be tested for this.  Past Medical History:  Diagnosis Date  . Anxiety   . Depression   . Endometriosis   . Hypertension     Past Surgical History:  Procedure Laterality Date  . APPENDECTOMY  04/19/2016   Procedure: APPENDECTOMY;  Surgeon: Lavonia Drafts, MD;  Location: [facility];  Service: Gynecology;;  . LAPAROTOMY Right 04/19/2016   Procedure: EXPLORATORY LAPAROTOMY WITH BILATERAL OOPHORECTOMY  LYSIS OF ADHESIONS;  Surgeon: Lavonia Drafts, MD;  Location: [facility];  Service: Gynecology;  Laterality: Right;    No Known Allergies   Outpatient Medications Prior to Visit  Medication Sig Dispense Refill  . estradiol (ESTRACE) 2 MG tablet Take 1 tablet (2 mg total) by mouth daily. 30 tablet 3  . lisinopril-hydrochlorothiazide (PRINZIDE,ZESTORETIC) 20-12.5 MG tablet Take 1 tablet by mouth daily.    . fluticasone (FLONASE) 50 MCG/ACT nasal spray Place 1 spray into both nostrils daily. (Patient not taking: Reported on 09/08/2016) 16 g 0  . Multiple Vitamin (MULTIVITAMIN WITH MINERALS) TABS tablet Take 1 tablet by mouth daily.    . benzonatate (TESSALON) 100 MG capsule Take 1 capsule (100 mg total) by mouth at bedtime as needed for cough. (Patient not taking:  Reported on 09/08/2016) 21 capsule 0  . metroNIDAZOLE (FLAGYL) 500 MG tablet Take 1 tablet (500 mg total) by mouth 2 (two) times daily. 14 tablet 0  . betamethasone acetate-betamethasone sodium phosphate (CELESTONE) injection 3 mg      No facility-administered medications prior to visit.     ROS Review of Systems  Constitutional: Negative for activity change and appetite change.  HENT: Negative for sinus pressure and sore throat.   Respiratory: Positive for cough. Negative for chest tightness, shortness of breath and wheezing.   Cardiovascular: Negative for chest pain and palpitations.  Gastrointestinal: Negative for abdominal distention, abdominal pain and constipation.  Genitourinary: Negative.   Musculoskeletal: Negative.   Psychiatric/Behavioral: Negative for behavioral problems and dysphoric mood.    Objective:  BP 134/76 (BP Location: Right Arm, Patient Position: Sitting, Cuff Size: Large)   Pulse 88   Temp 98.6 F (37 C) (Oral)   Ht 5\' 7"  (1.702 m)   Wt (!) 314 lb 3.2 oz (142.5 kg)   SpO2 98%   BMI 49.21 kg/m   BP/Weight 09/08/2016 08/01/2016 0000000  Systolic BP [id number]  Diastolic BP 76 99 78  Wt. (Lbs) 314.2 309.9 -  BMI 49.21 48.54 -      Physical Exam  Constitutional: She is oriented to person, place, and time. She appears well-developed and well-nourished.  Cardiovascular: Normal rate, normal heart sounds and intact distal pulses.   No murmur heard. Pulmonary/Chest: Effort normal and breath sounds normal. She has no wheezes. She has no rales. She exhibits no tenderness.  Abdominal:  Soft. Bowel sounds are normal. She exhibits no distension and no mass. There is no tenderness.  Musculoskeletal: Normal range of motion.  Neurological: She is alert and oriented to person, place, and time.     Assessment & Plan:   1. Essential hypertension Advised to discontinue lisinopril Losartan/HCTZ sent to pharmacy on site which will be more cost effective -  losartan-hydrochlorothiazide (HYZAAR) 100-25 MG tablet; Take 1 tablet by mouth daily.  Dispense: 30 tablet; Refill: 3  2. Cough Cough could be ACE inhibitor induced versus sinus drainage We'll place an antihistamine. While discontinuing ACE inhibitor - benzonatate (TESSALON) 100 MG capsule; Take 1 capsule (100 mg total) by mouth 3 (three) times daily as needed for cough.  Dispense: 21 capsule; Refill: 0 - cetirizine (ZYRTEC) 10 MG tablet; Take 1 tablet (10 mg total) by mouth daily.  Dispense: 30 tablet; Refill: 3  3. Food allergy Offered EpiPen but she would rather use Benadryl in the event of any allergic reaction. Reviewed ED precautions. - Food Allergy Panel - Allergy Panel 18, Nut Mix Group - Allergy Panel, Animal Group - Allergy Panel 19, Seafood Group   Meds ordered this encounter  Medications  . benzonatate (TESSALON) 100 MG capsule    Sig: Take 1 capsule (100 mg total) by mouth 3 (three) times daily as needed for cough.    Dispense:  21 capsule    Refill:  0  . losartan-hydrochlorothiazide (HYZAAR) 100-25 MG tablet    Sig: Take 1 tablet by mouth daily.    Dispense:  30 tablet    Refill:  3  . cetirizine (ZYRTEC) 10 MG tablet    Sig: Take 1 tablet (10 mg total) by mouth daily.    Dispense:  30 tablet    Refill:  3    Follow-up: Return in about 1 month (around 10/09/2016) for Follow-up on cough and hypertension.   Arnoldo Morale MD

## 2016-09-09 LAB — ALLERGY PANEL 18, NUT MIX GROUP
Almonds: <0.1 kU/L
Cashew IgE: <0.1 kU/L
Coconut: <0.1 kU/L
Hazelnut: <0.1 kU/L
Peanut IgE: <0.1 kU/L
Pecan Nut: <0.1 kU/L
Sesame Seed f10: <0.1 kU/L

## 2016-09-09 LAB — ALLERGY PANEL, ANIMAL GROUP: Allergen, Mouse Urine Protein, e78: <0.1 kU/L

## 2016-09-13 LAB — FOOD ALLERGY PANEL
Milk IgE: <0.1 kU/L
Walnut: <0.1 kU/L
Wheat IgE: <0.1 kU/L

## 2016-09-13 LAB — ALLERGY PANEL 19, SEAFOOD GROUP
Fish Cod: <0.1 kU/L
Lobster: <0.1 kU/L
Shrimp IgE: <0.1 kU/L
Tuna IgE: <0.1 kU/L

## 2016-09-14 ENCOUNTER — Telehealth: Payer: Self-pay

## 2016-09-14 NOTE — Telephone Encounter (Signed)
-----   Message from Arnoldo Morale, MD sent at 09/13/2016  5:03 PM EST ----- Please inform the patient that labs are normal. Thank you.

## 2016-09-14 NOTE — Telephone Encounter (Signed)
Writer called patient to discuss lab results.  LVM requesting for patient to return call to discuss.

## 2016-09-14 NOTE — Telephone Encounter (Signed)
Writer spoke with patient and was able to give her the lab results.  Patient stated understanding and said she saw the results on My Chart- she feels she is allergic to tomatoes and wasn't tested for them. Writer suggested that she not eat them if she feels that she is allergic to them.

## 2016-09-19 ENCOUNTER — Ambulatory Visit (INDEPENDENT_AMBULATORY_CARE_PROVIDER_SITE_OTHER): Payer: Self-pay | Admitting: Obstetrics & Gynecology

## 2016-09-19 ENCOUNTER — Encounter: Payer: Self-pay | Admitting: Obstetrics & Gynecology

## 2016-09-19 VITALS — BP 137/94 | HR 98 | Ht 67.0 in | Wt 311.0 lb

## 2016-09-19 DIAGNOSIS — Z01419 Encounter for gynecological examination (general) (routine) without abnormal findings: Secondary | ICD-10-CM

## 2016-09-19 DIAGNOSIS — Z7189 Other specified counseling: Secondary | ICD-10-CM

## 2016-09-19 DIAGNOSIS — L910 Hypertrophic scar: Secondary | ICD-10-CM

## 2016-09-19 DIAGNOSIS — Z0000 Encounter for general adult medical examination without abnormal findings: Secondary | ICD-10-CM

## 2016-09-19 DIAGNOSIS — R102 Pelvic and perineal pain: Secondary | ICD-10-CM

## 2016-09-19 MED ORDER — TRIAMCINOLONE ACETONIDE 40 MG/ML IJ SUSP
40.0000 mg | Freq: Once | INTRAMUSCULAR | Status: AC
  Administered 2016-09-19: 40 mg

## 2016-09-19 MED ORDER — MEDROXYPROGESTERONE ACETATE 10 MG PO TABS: 5.0000 mg | ORAL_TABLET | Freq: Every day | ORAL | 4 refills | Status: DC

## 2016-09-19 MED ORDER — ESTRADIOL 2 MG PO TABS: 2.0000 mg | ORAL_TABLET | Freq: Every day | ORAL | 4 refills | Status: DC

## 2016-09-19 NOTE — Progress Notes (Signed)
Subjective:     Cathy Bryan is a 31 y.o. female here for a routine exam.  Current complaints: pt c/o abnormal pain in hr pelvis that is similar to the pain that she had when she was treated for bilateral endometriomas.  She had this pain twice and it was fleeting.  She did not have to take any meds for this pain. She reports that her hot flushes are well controlled with the EES.  She is on her menses currently and is on her 5 days of  Provera.    Gynecologic History Patient's last menstrual period was 09/18/2016 (exact date). Contraception: n/a pt s/p bilateral oophorectomy Last Pap: 2-3 years prev. Results were: abnormal at [facility]. They did a bx which was neg Last mammogram: n/a  Obstetric History OB History  Gravida Para Term Preterm AB Living  0 0 0 0 0 0  SAB TAB Ectopic Multiple Live Births  0 0 0 0         The following portions of the patient's history were reviewed and updated as appropriate: allergies, current medications, past family history, past medical history, past social history, past surgical history and problem list.  Review of Systems Pertinent items are noted in HPI.    Objective:  BP (!) 137/94   Pulse 98   Ht 5\' 7"  (1.702 m)   Wt (!) 311 lb (141.1 kg)   LMP 09/18/2016 (Exact Date)   BMI 48.71 kg/m  General Appearance:    Alert, cooperative, no distress, appears stated age  Head:    Normocephalic, without obvious abnormality, atraumatic  Eyes:    conjunctiva/corneas clear, EOM's intact, both eyes  Ears:    Normal external ear canals, both ears  Nose:   Nares normal, septum midline, mucosa normal, no drainage    or sinus tenderness  Throat:   Lips, mucosa, and tongue normal; teeth and gums normal  Neck:   Supple, symmetrical, trachea midline, no adenopathy;    thyroid:  no enlargement/tenderness/nodules  Back:     Symmetric, no curvature, ROM normal, no CVA tenderness  Lungs:     Clear to auscultation bilaterally, respirations unlabored  Chest Wall:     No tenderness or deformity   Heart:    Regular rate and rhythm, S1 and S2 normal, no murmur, rub   or gallop  Breast Exam:    No tenderness, masses, or nipple abnormality  Abdomen:     Soft, non-tender, bowel sounds active all four quadrants,    no masses, no organomegaly. Keloid os incision. PROCEDURE: .  Area cleaned with alcohol.  1cc of Kenalog 40 + Lidocaine 2% was injected into the keloid.  There were no complications.    Genitalia:    Normal female without lesion, discharge or tenderness     Extremities:   Extremities normal, atraumatic, no cyanosis or edema  Pulses:   2+ and symmetric all extremities  Skin:   Skin color, texture, turgor normal, no rashes or lesions with the exception of the above      Assessment:    Healthy female exam.   Keloid of surgical incision ERT counseling Pelvic pain Obesity    Plan:    Follow up in: 1 year. or sooner prn Pt instructed about s/sx of infection after injection of keloid.  She tolerated the procedure well.  She will f/u in 1 1/2 weeks for repeat injection if needed Pelvic US to eval adnexa Discussed weight management with pt. Recommended program of walking  and healthy eating. F/u PAP with HPV  Riyaan Heroux L. [name], M.D., Cherlynn June

## 2016-09-19 NOTE — Patient Instructions (Signed)
Endometriosis Endometriosis is a condition in which the tissue that lines the uterus (endometrium) grows outside of its normal location. The tissue may grow in many locations close to the uterus, but it commonly grows on the ovaries, fallopian tubes, vagina, or bowel. When the uterus sheds the endometrium every menstrual cycle, there is bleeding wherever the endometrial tissue is located. This can cause pain because blood is irritating to tissues that are not normally exposed to it. What are the causes? The cause of endometriosis is not known. What increases the risk? You may be more likely to develop endometriosis if you:  Have a family history of endometriosis.  Have never given birth.  Started your period at age 11 or younger.  Have high levels of estrogen in your body.  Were exposed to a certain medicine (diethylstilbestrol) before you were born (in utero).  Had low birth weight.  Were born as a twin, triplet, or other multiple.  Have a BMI of less than 25. BMI is an estimate of body fat and is calculated from height and weight. What are the signs or symptoms? Often, there are no symptoms of this condition. If you do have symptoms, they may:  Vary depending on where your endometrial tissue is growing.  Occur during your menstrual period (most common) or midcycle.  Come and go, or you may go months with no symptoms at all.  Stop with menopause. Symptoms may include:  Pain in the back or abdomen.  Heavier bleeding during periods.  Pain during sex.  Painful bowel movements.  Infertility.  Pelvic pain.  Bleeding more than once a month. How is this diagnosed? This condition is diagnosed based on your symptoms and a physical exam. You may have tests, such as:  Blood tests and urine tests. These may be done to help rule out other possible causes of your symptoms.  Ultrasound, to look for abnormal tissues.  An X-ray of the lower bowel (barium enema).  An ultrasound  that is done through the vagina (transvaginally).  CT scan.  MRI.  Laparoscopy. In this procedure, a lighted, pencil-sized instrument called a laparoscope is inserted into your abdomen through an incision. The laparoscope allows your health care provider to look at the organs inside your body and check for abnormal tissue to confirm the diagnosis. If abnormal tissue is found, your health care provider may remove a small piece of tissue (biopsy) to be examined under a microscope. How is this treated? Treatment for this condition may include:  Medicines to relieve pain, such as NSAIDs.  Hormone therapy. This involves using artificial (synthetic) hormones to reduce endometrial tissue growth. Your health care provider may recommend using a hormonal form of birth control, or other medicines.  Surgery. This may be done to remove abnormal endometrial tissue.  In some cases, tissue may be removed using a laparoscope and a laser (laparoscopic laser treatment).  In severe cases, surgery may be done to remove the fallopian tubes, uterus, and ovaries (hysterectomy). Follow these instructions at home:  Take over-the-counter and prescription medicines only as told by your health care provider.  Do not drive or use heavy machinery while taking prescription pain medicine.  Try to avoid activities that cause pain, including sexual activity.  Keep all follow-up visits as told by your health care provider. This is important. Contact a health care provider if:  You have pain in the area between your hip bones (pelvic area) that occurs:  Before, during, or after your period.  In  between your period and gets worse during your period.  During or after sex.  With bowel movements or urination, especially during your period.  You have problems getting pregnant.  You have a fever. Get help right away if:  You have severe pain that does not get better with medicine.  You have severe nausea and  vomiting, or you cannot eat without vomiting.  You have pain that affects only the lower, right side of your abdomen.  You have abdominal pain that gets worse.  You have abdominal swelling.  You have blood in your stool. This information is not intended to replace advice given to you by your health care provider. Make sure you discuss any questions you have with your health care provider. Document Released: 08/26/2000 Document Revised: 06/03/2016 Document Reviewed: 01/30/2016 Elsevier Interactive Patient Education  2017 Elsevier Inc.  

## 2016-09-22 LAB — CYTOLOGY - PAP
Chlamydia: NEGATIVE
Diagnosis: NEGATIVE
Neisseria Gonorrhea: NEGATIVE
TRICH (WINDOWPATH): NEGATIVE

## 2016-09-23 ENCOUNTER — Ambulatory Visit (HOSPITAL_COMMUNITY)
Admission: RE | Admit: 2016-09-23 | Discharge: 2016-09-23 | Disposition: A | Discharge status: Home / Self Care | Payer: Self-pay | Source: Ambulatory Visit | Attending: Obstetrics & Gynecology | Admitting: Obstetrics & Gynecology

## 2016-09-23 DIAGNOSIS — Z90721 Acquired absence of ovaries, unilateral: Secondary | ICD-10-CM | POA: Insufficient documentation

## 2016-09-23 DIAGNOSIS — Z01419 Encounter for gynecological examination (general) (routine) without abnormal findings: Secondary | ICD-10-CM

## 2016-09-23 DIAGNOSIS — D259 Leiomyoma of uterus, unspecified: Secondary | ICD-10-CM | POA: Insufficient documentation

## 2016-09-23 DIAGNOSIS — R102 Pelvic and perineal pain: Secondary | ICD-10-CM | POA: Insufficient documentation

## 2016-09-26 ENCOUNTER — Encounter: Payer: Self-pay | Admitting: General Practice

## 2016-10-06 MED FILL — ?CETIRIZINE HCL 10 MG TABLE: 10 | 30 days supply | Qty: 30 | Fill #1

## 2016-10-06 MED FILL — LOSARTAN-HCTZ 100-25 MG TAB: 100-25 | 30 days supply | Qty: 30 | Fill #1

## 2016-11-10 MED FILL — LOSARTAN-HCTZ 100-25 MG TAB: 100-25 | 30 days supply | Qty: 30 | Fill #2

## 2016-12-21 MED FILL — LOSARTAN-HCTZ 100-25 MG TAB: 100-25 | 30 days supply | Qty: 30 | Fill #3

## 2017-01-25 ENCOUNTER — Encounter: Payer: Self-pay | Admitting: Obstetrics & Gynecology

## 2017-01-31 ENCOUNTER — Other Ambulatory Visit: Payer: Self-pay | Admitting: Family Medicine

## 2017-01-31 DIAGNOSIS — I10 Essential (primary) hypertension: Secondary | ICD-10-CM

## 2017-02-03 ENCOUNTER — Encounter: Payer: Self-pay | Admitting: Family Medicine

## 2017-02-03 MED FILL — LOSARTAN-HCTZ 100-25 MG TAB: 100-25 | 30 days supply | Qty: 30 | Fill #0

## 2017-02-27 ENCOUNTER — Other Ambulatory Visit: Payer: Self-pay | Admitting: Family Medicine

## 2017-02-27 DIAGNOSIS — I10 Essential (primary) hypertension: Secondary | ICD-10-CM

## 2017-03-07 MED FILL — LOSARTAN-HCTZ 100-25 MG TAB: 100-25 | 30 days supply | Qty: 30 | Fill #1

## 2017-04-07 ENCOUNTER — Ambulatory Visit: Payer: Self-pay | Admitting: Licensed Clinical Social Worker

## 2017-04-07 ENCOUNTER — Encounter: Payer: Self-pay | Admitting: Family Medicine

## 2017-04-07 ENCOUNTER — Ambulatory Visit: Payer: Self-pay | Attending: Family Medicine | Admitting: Family Medicine

## 2017-04-07 VITALS — BP 137/76 | HR 88 | Temp 98.5°F | Resp 18 | Ht 67.0 in | Wt 299.6 lb

## 2017-04-07 DIAGNOSIS — N809 Endometriosis, unspecified: Secondary | ICD-10-CM | POA: Insufficient documentation

## 2017-04-07 DIAGNOSIS — Z9889 Other specified postprocedural states: Secondary | ICD-10-CM

## 2017-04-07 DIAGNOSIS — F329 Major depressive disorder, single episode, unspecified: Secondary | ICD-10-CM

## 2017-04-07 DIAGNOSIS — F419 Anxiety disorder, unspecified: Secondary | ICD-10-CM | POA: Insufficient documentation

## 2017-04-07 DIAGNOSIS — Z90722 Acquired absence of ovaries, bilateral: Secondary | ICD-10-CM | POA: Insufficient documentation

## 2017-04-07 DIAGNOSIS — I10 Essential (primary) hypertension: Secondary | ICD-10-CM | POA: Insufficient documentation

## 2017-04-07 DIAGNOSIS — F32A Depression, unspecified: Secondary | ICD-10-CM | POA: Insufficient documentation

## 2017-04-07 MED ORDER — LOSARTAN POTASSIUM-HCTZ 100-25 MG PO TABS: 1.0000 | ORAL_TABLET | Freq: Every day | ORAL | 5 refills | Status: DC

## 2017-04-07 MED FILL — LOSARTAN-HCTZ 100-25 MG TAB: 100-25 | 30 days supply | Qty: 30 | Fill #0

## 2017-04-07 NOTE — Progress Notes (Signed)
Subjective:  Patient ID: Cathy Bryan, female    DOB: [birth year]  Age: 31 y.o. MRN: [id number]  CC: Hypertension   HPI Cathy Bryan is a 31 year old female with a history of Obesity, hypertension, bilateral endometrioma status post exploratory laparotomy with bilateral oophorectomy and lysis of adhesions, right appendectomy in 04/2016 who presents today for follow-up visit.  She has been compliant with her antihypertensives and is needing a refill.  Last visit to GYN was in 09/2016 and she is currently on Estrace  She complains of depression, lack of motivation, anhedonia which she states dates back to problems she has had several years ago and surrounding herself with other people's problems and not focusing on her hers. She would like to see a therapist but is refusing medications at this time. All she wants to do is lie in bed and drink. Denies suicidal ideations or intentions.  She has lost 15 pounds in the last several months.  Past Medical History:  Diagnosis Date  . Anxiety   . Depression   . Endometriosis   . Hypertension     Past Surgical History:  Procedure Laterality Date  . APPENDECTOMY  04/19/2016   Procedure: APPENDECTOMY;  Surgeon: Lavonia Drafts, MD;  Location: [facility];  Service: Gynecology;;  . LAPAROTOMY Right 04/19/2016   Procedure: EXPLORATORY LAPAROTOMY WITH BILATERAL OOPHORECTOMY  LYSIS OF ADHESIONS;  Surgeon: Lavonia Drafts, MD;  Location: [facility];  Service: Gynecology;  Laterality: Right;    No Known Allergies   Outpatient Medications Prior to Visit  Medication Sig Dispense Refill  . cetirizine (ZYRTEC) 10 MG tablet Take 1 tablet (10 mg total) by mouth daily. 30 tablet 3  . estradiol (ESTRACE) 2 MG tablet Take 1 tablet (2 mg total) by mouth daily. 90 tablet 4  . medroxyPROGESTERone (PROVERA) 10 MG tablet Take 0.5 tablets (5 mg total) by mouth daily. 5 days out of the month 15 tablet 4  . losartan-hydrochlorothiazide (HYZAAR) 100-25 MG  tablet TAKE 1 TABLET BY MOUTH DAILY. 30 tablet 3   No facility-administered medications prior to visit.     ROS Review of Systems  Constitutional: Negative for activity change, appetite change and fatigue.  HENT: Negative for congestion, sinus pressure and sore throat.   Eyes: Negative for visual disturbance.  Respiratory: Negative for cough, chest tightness, shortness of breath and wheezing.   Cardiovascular: Negative for chest pain and palpitations.  Gastrointestinal: Negative for abdominal distention, abdominal pain and constipation.  Endocrine: Negative for polydipsia.  Genitourinary: Negative for dysuria and frequency.  Musculoskeletal: Negative for arthralgias and back pain.  Skin: Negative for rash.  Neurological: Negative for tremors, light-headedness and numbness.  Hematological: Does not bruise/bleed easily.  Psychiatric/Behavioral: Positive for dysphoric mood. Negative for agitation, behavioral problems and suicidal ideas.    Objective:  BP 137/76 (BP Location: Left Arm, Patient Position: Sitting, Cuff Size: Normal)   Pulse 88   Temp 98.5 F (36.9 C) (Oral)   Resp 18   Ht 5' 7"  (1.702 m)   Wt 299 lb 9.6 oz (135.9 kg)   LMP 03/15/2017   SpO2 100%   BMI 46.92 kg/m   BP/Weight 04/07/2017 09/19/2016 72/62/0355  Systolic BP 974 163 845  Diastolic BP 76 94 76  Wt. (Lbs) 299.6 311 314.2  BMI 46.92 48.71 49.21      Physical Exam  Constitutional: She is oriented to person, place, and time. She appears well-developed and well-nourished.  Cardiovascular: Normal rate, normal heart sounds and intact distal pulses.  No murmur heard. Pulmonary/Chest: Effort normal and breath sounds normal. She has no wheezes. She has no rales. She exhibits no tenderness.  Abdominal: Soft. Bowel sounds are normal. She exhibits no distension and no mass. There is no tenderness.  Musculoskeletal: Normal range of motion.  Neurological: She is alert and oriented to person, place, and time.    Skin: Skin is warm and dry.  Psychiatric:  Dysphoric mood     Assessment & Plan:   1. Essential hypertension Controlled - losartan-hydrochlorothiazide (HYZAAR) 100-25 MG tablet; Take 1 tablet by mouth daily.  Dispense: 30 tablet; Refill: 5 - CMP14+EGFR; Future - Lipid panel; Future  2. Anxiety and depression Not in crisis at this time Declines pharmacotherapy but I have informed her that if she changes her mind I will be happy to place her on an SSRI Counseling performed in the clinic with the aid of the licensed clinical social worker We will be referring her out as well for therapy.  3. Postoperative state Status post bilateral oophorectomy for endometrioma Continue Estrace. Keep appointment with GYN  Meds ordered this encounter  Medications  . losartan-hydrochlorothiazide (HYZAAR) 100-25 MG tablet    Sig: Take 1 tablet by mouth daily.    Dispense:  30 tablet    Refill:  5    Follow-up: Return in about 3 months (around 07/08/2017) for follow up on anxiety and depression.   This note has been created with Surveyor, quantity. Any transcriptional errors are unintentional.     Arnoldo Morale MD

## 2017-04-07 NOTE — BH Specialist Note (Signed)
Integrated Behavioral Health Initial Visit  MRN: [id number] Name: Cathy Bryan   Session Start time: 3:25 PM Session End time: 4:00 PM Total time: 35 minutes  Type of Service: [place] Interpretor:No. Interpretor Name and Language: N/A   Warm Hand Off Completed.       SUBJECTIVE: Cathy Bryan is a 31 y.o. female accompanied by patient. Patient was referred by Dr. Jarold Song for depression and anxiety. Patient reports the following symptoms/concerns: overwhelming feelings of sadness and worry, difficulty sleeping, low energy, difficulty concentrating, irritability, crying episodes, and withdrawn behavior Duration of problem: 6 months; Severity of problem: severe  OBJECTIVE: Mood: Dysphoric and Affect: Depressed Risk of harm to self or others: No plan to harm self or others   LIFE CONTEXT: Family and Social: Pt receives support from family and friends. 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 Self-Care: Pt reports daily alcohol use (1 bottle of wine) Life Changes: Pt reports stress from work, school, and ongoing medical concerns   GOALS ADDRESSED: Patient will reduce symptoms of: anxiety and depression and increase knowledge and/or ability of: coping skills and also: Increase adequate support systems for patient/family and Decrease self-medicating behaviors   INTERVENTIONS: Solution-Focused Strategies, Supportive Counseling, Psychoeducation and/or Health Education and Link to [employer]  Standardized Assessments completed: GAD-7 and PHQ 2&9  ASSESSMENT: Patient currently experiencing depression and anxiety triggered by stress from work, school, and ongoing medical concerns. She reports overwhelming feelings of sadness and worry, difficulty sleeping, low energy, difficulty concentrating,  irritability, crying episodes, withdrawn behavior, and daily substance use. Patient reports not utilizing support system that consists of family and friends. Patient may benefit from psychoeducation, psychotherapy, and medication management. [facility] educated pt on the correlation between physical and mental health, in addition, to how substance use can negatively impact one's health. LCSWA discussed benefits of applying healthy coping skills to daily routine. Pt successfully identified healthy strategies to utilize on a daily basis. She is not open to medication management. LCSWA provided community resources on crisis intervention, psychotherapy, and grounding interventions.   PLAN: 1. Follow up with behavioral health clinician on : Pt was encouraged to contact LCSWA if symptoms worsen or fail to improve to schedule behavioral appointments at [facility]. 2. Behavioral recommendations: LCSWA recommends that pt apply healthy coping skills discussed. Pt is encouraged to schedule follow up appointment with LCSWA 3. Referral(s): [facility] (LME/Outside Clinic) 4. "From scale of 1-10, how likely are you to follow plan?": 8/10  Rebekah Chesterfield, LCSW 04/12/17 5:39 PM

## 2017-04-07 NOTE — Patient Instructions (Signed)

## 2017-04-11 ENCOUNTER — Ambulatory Visit: Payer: Self-pay | Attending: Family Medicine

## 2017-04-11 DIAGNOSIS — I10 Essential (primary) hypertension: Secondary | ICD-10-CM

## 2017-04-12 LAB — CMP14+EGFR
A/G RATIO: 1.3 (ref 1.2–2.2)
ALBUMIN: 3.8 g/dL (ref 3.5–5.5)
ALT: 14 IU/L (ref 0–32)
AST: 15 IU/L (ref 0–40)
Alkaline Phosphatase: 54 IU/L (ref 39–117)
BUN / CREAT RATIO: 22 (ref 9–23)
BUN: 15 mg/dL (ref 6–20)
Bilirubin Total: 0.2 mg/dL (ref 0.0–1.2)
CALCIUM: 8.8 mg/dL (ref 8.7–10.2)
CO2: 22 mmol/L (ref 20–29)
CREATININE: 0.69 mg/dL (ref 0.57–1.00)
Chloride: 101 mmol/L (ref 96–106)
GFR, EST AFRICAN AMERICAN: 137 mL/min/{1.73_m2} (ref >59)
GFR, EST NON AFRICAN AMERICAN: 119 mL/min/{1.73_m2} (ref >59)
GLOBULIN, TOTAL: 2.9 g/dL (ref 1.5–4.5)
Glucose: 90 mg/dL (ref 65–99)
Potassium: 4 mmol/L (ref 3.5–5.2)
Sodium: 140 mmol/L (ref 134–144)
TOTAL PROTEIN: 6.7 g/dL (ref 6.0–8.5)

## 2017-04-12 LAB — LIPID PANEL
CHOLESTEROL TOTAL: 98 mg/dL — AB (ref 100–199)
Chol/HDL Ratio: 2.1 ratio (ref 0.0–4.4)
HDL: 46 mg/dL (ref >39)
LDL Calculated: 34 mg/dL (ref 0–99)
TRIGLYCERIDES: 88 mg/dL (ref 0–149)
VLDL Cholesterol Cal: 18 mg/dL (ref 5–40)

## 2017-04-20 ENCOUNTER — Ambulatory Visit: Payer: Self-pay | Admitting: Obstetrics & Gynecology

## 2017-04-27 ENCOUNTER — Ambulatory Visit: Payer: Self-pay | Admitting: Obstetrics & Gynecology

## 2017-05-01 ENCOUNTER — Other Ambulatory Visit: Payer: Self-pay | Admitting: Family Medicine

## 2017-05-01 ENCOUNTER — Encounter: Payer: Self-pay | Admitting: Family Medicine

## 2017-05-01 DIAGNOSIS — F419 Anxiety disorder, unspecified: Principal | ICD-10-CM

## 2017-05-01 DIAGNOSIS — F329 Major depressive disorder, single episode, unspecified: Secondary | ICD-10-CM

## 2017-05-01 MED ORDER — FLUOXETINE HCL 20 MG PO TABS: 20.0000 mg | ORAL_TABLET | Freq: Every day | ORAL | 3 refills | Status: DC

## 2017-05-01 MED FILL — FLUoxetine HCL 20 MG CAPS: 20 | 30 days supply | Qty: 30 | Fill #0

## 2017-05-01 NOTE — Progress Notes (Signed)
Placed on Prozac patient email request for pharmacotherapy for anxiety and depression.

## 2017-05-02 ENCOUNTER — Telehealth: Payer: Self-pay | Admitting: Licensed Clinical Social Worker

## 2017-05-02 NOTE — Telephone Encounter (Signed)
Pt contacted LCSWA via telephone and left message for a return call.  LCSWA returned call and left HIPPA compliant message.

## 2017-05-12 MED FILL — LOSARTAN-HCTZ 100-25 MG TAB: 100-25 | 30 days supply | Qty: 30 | Fill #1

## 2017-05-23 ENCOUNTER — Ambulatory Visit (INDEPENDENT_AMBULATORY_CARE_PROVIDER_SITE_OTHER): Payer: No Typology Code available for payment source | Admitting: Psychiatry

## 2017-05-23 DIAGNOSIS — Z818 Family history of other mental and behavioral disorders: Secondary | ICD-10-CM

## 2017-05-23 DIAGNOSIS — G4733 Obstructive sleep apnea (adult) (pediatric): Secondary | ICD-10-CM

## 2017-05-23 DIAGNOSIS — G4700 Insomnia, unspecified: Secondary | ICD-10-CM

## 2017-05-23 DIAGNOSIS — F419 Anxiety disorder, unspecified: Secondary | ICD-10-CM

## 2017-05-23 DIAGNOSIS — F331 Major depressive disorder, recurrent, moderate: Secondary | ICD-10-CM

## 2017-05-23 DIAGNOSIS — F1910 Other psychoactive substance abuse, uncomplicated: Secondary | ICD-10-CM

## 2017-05-23 DIAGNOSIS — R450 Nervousness: Secondary | ICD-10-CM

## 2017-05-23 DIAGNOSIS — R110 Nausea: Secondary | ICD-10-CM

## 2017-05-23 DIAGNOSIS — M549 Dorsalgia, unspecified: Secondary | ICD-10-CM

## 2017-05-23 DIAGNOSIS — F1290 Cannabis use, unspecified, uncomplicated: Secondary | ICD-10-CM

## 2017-05-23 DIAGNOSIS — R51 Headache: Secondary | ICD-10-CM

## 2017-05-23 DIAGNOSIS — E669 Obesity, unspecified: Secondary | ICD-10-CM

## 2017-05-23 MED ORDER — HYDROXYZINE HCL 10 MG PO TABS: 10.0000 mg | ORAL_TABLET | Freq: Two times a day (BID) | ORAL | 2 refills | Status: DC | PRN

## 2017-05-23 MED ORDER — MIRTAZAPINE 15 MG PO TABS: 15.0000 mg | ORAL_TABLET | Freq: Every day | ORAL | 2 refills | Status: DC

## 2017-05-23 MED FILL — hydrOXYzine HCL 10 MG TABS: 10 | 15 days supply | Qty: 30 | Fill #0

## 2017-05-23 MED FILL — ?MIRTAZAPINE 15 MG TABLET: 15 | 30 days supply | Qty: 30 | Fill #0

## 2017-05-23 NOTE — Patient Instructions (Signed)
[facility] [phone]

## 2017-05-23 NOTE — Progress Notes (Signed)
Psychiatric Initial Adult Assessment   Patient Identification: Cathy Bryan MRN:  [id number] Date of Evaluation:  05/23/2017 Referral Source: self, pcp Chief Complaint:  depression Visit Diagnosis:    ICD-10-CM   1. Obstructive sleep apnea syndrome G47.33 Cpap titration  2. Moderate episode of recurrent major depressive disorder (HCC) F33.1 mirtazapine (REMERON) 15 MG tablet    History of Present Illness:  Cathy Bryan is a 31 year old female with morbid obesity, untreated sleep apnea confirmed by sleep study in 2015, and a history of depression ongoing for the past 2 years who presents today for psychiatric intake assessment. She has never engaged in individual therapy or psychiatric care.   She reports that she is in a "kinda relationship" with an individual, female, who she has known for 5 years. She reports that this individual and her are a good support for each other. The patient reports that she has had relationships with both men and women, and envisions having a baby and family in the future, whether this is with a female or female partner.  She feels disappointed about where she is in terms of her career and social life, and not having a consistent partner.   She reports that social stressors, and issues related to her health, have exacerbated her depression over the past 6 months, and she has noticed increase in her alcohol use, and her use of marijuana to help with her anxiety, sleep difficulties, and she was recently started on Prozac by primary care provider for depression. She reports that Prozac has caused her a lot of nausea, headaches, and no significant benefit seen thus far.  She denies any suicidality, but reports that her mood has affected her ability to participate in her work, and her schooling. She is in the Masters program at [employer] to get her masters degree in rehabilitation therapy. 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  She reports  that she tends to like her job at [employer], chop, but she has felt more distracted, irritable, and this is not been related to the other employees, but more due to her internal feelings of sadness and depression.  Spent time with the patient educating her on the deleterious effects of marijuana and alcohol, particularly when one is continuing to struggle with mood and anxiety symptoms. She recognizes this, and agrees to start working on tapering her alcohol and marijuana use.    She reports that she has always been fairly avoidant of discussing her feelings, and realizes the irony that she is becoming a therapist. She reports that her mom and dad were always very supportive, and she denies any particular physical or emotional trauma. She reports that her dad was army, so he was fairly concrete at times, but she felt generally comfortable to talk about her feelings. She feels that much of her stigma for mental health comes from her identity as an African-American female, and the response that she gets from other African-American females when she talks about depression or anxiety.    She reports that she knows that she needs to be in therapy, so she can start working on her ability to talk about her feelings, so that she can actually work on her depression. She would like to switch antidepressants to something that would help her with sleep.  she also reports that she has had low appetite, and chronic nausea. We discussed starting Remeron 15 mg nightly for sleep and mood symptoms, and also obtaining CPAP titration given  that this contributes quite directly to treatment resistant depression. Discussed that she can use Vistaril as needed for anxiety or frustration.   Associated Signs/Symptoms: Depression Symptoms:  depressed mood, anhedonia, insomnia, hypersomnia, fatigue, feelings of worthlessness/guilt, difficulty concentrating, anxiety, (Hypo) Manic Symptoms:  Irritable Mood, Anxiety Symptoms:   Excessive Worry, Psychotic Symptoms:  none PTSD Symptoms: Negative  Past Psychiatric History: No prior psychiatric treatment or therapy, PCP managed medication, started Prozac  Previous Psychotropic Medications: Yes - prozac, nausea, headaches  Substance Abuse History in the last 12 months:  Yes.    Consequences of Substance Abuse: worsening mood, sleep, apnea  Past Medical History:  Past Medical History:  Diagnosis Date  . Anxiety   . Depression   . Endometriosis   . Hypertension     Past Surgical History:  Procedure Laterality Date  . APPENDECTOMY  04/19/2016   Procedure: APPENDECTOMY;  Surgeon: Lavonia Drafts, MD;  Location: [facility];  Service: Gynecology;;  . LAPAROTOMY Right 04/19/2016   Procedure: EXPLORATORY LAPAROTOMY WITH BILATERAL OOPHORECTOMY  LYSIS OF ADHESIONS;  Surgeon: Lavonia Drafts, MD;  Location: [facility];  Service: Gynecology;  Laterality: Right;    Family Psychiatric History: Family history of depression in her brother, and sleep apnea in both mom and dad  Family History:  Family History  Problem Relation Age of Onset  . Hypertension Mother   . Hypertension Father   . Diabetes Father     Social History:   Social History   Social History  . Marital status: Single    Spouse name: N/A  . Number of children: N/A  . Years of education: N/A   Social History Main Topics  . Smoking status: Never Smoker  . Smokeless tobacco: Never Used  . Alcohol use 1.2 oz/week    2 Glasses of wine per week     Comment: occasionally  . Drug use: Yes    Types: Marijuana     Comment: yesterday  . Sexual activity: Yes    Birth control/ protection: None   Other Topics Concern  . Not on file   Social History Narrative  . No narrative on file    Additional Social History: Bachelors degree from [employer] in [place]; currently in the Masters program at [employer] for her rehabilitation therapy degree. 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 cetirizine (ZYRTEC) 10 MG tablet Take 1 tablet (10 mg total) by mouth daily. 30 tablet 3  . estradiol (ESTRACE) 2 MG tablet Take 1 tablet (2 mg total) by mouth daily. 90 tablet 4  . hydrOXYzine (ATARAX/VISTARIL) 10 MG tablet Take 1 tablet (10 mg total) by mouth 2 (two) times daily as needed for anxiety (panic). 30 tablet 2  . losartan-hydrochlorothiazide (HYZAAR) 100-25 MG tablet Take 1 tablet by mouth daily. 30 tablet 5  . medroxyPROGESTERone (PROVERA) 10 MG tablet Take 0.5 tablets (5 mg total) by mouth daily. 5 days out of the month 15 tablet 4  . mirtazapine (REMERON) 15 MG tablet  Take 1 tablet (15 mg total) by mouth at bedtime. 30 tablet 2   No current facility-administered medications for this visit.     Neurologic: Headache: Yes Seizure: Negative Paresthesias:Negative  Musculoskeletal: Strength & Muscle Tone: within normal limits Gait & Station: normal Patient leans: N/A  Psychiatric Specialty Exam: Review of Systems  Constitutional: Negative.   HENT: Negative.   Respiratory: Negative.   Cardiovascular: Negative.   Gastrointestinal: Positive for nausea.  Musculoskeletal: Positive for back pain.  Neurological: Positive for headaches.  Psychiatric/Behavioral: Positive for depression and substance abuse. Negative for hallucinations, memory loss and suicidal ideas. The patient is nervous/anxious and has insomnia.     There were no vitals taken for this visit.There is no height or weight on file to calculate BMI.   General Appearance: Casual and Fairly Groomed  Eye Contact:  Good  Speech:  Clear and Coherent  Volume:  Decreased  Mood:  Depressed and Dysphoric  Affect:  Constricted, Depressed and Tearful  Thought Process:  Goal Directed  Orientation:  Full (Time, Place, and Person)  Thought Content:  Logical  Suicidal Thoughts:  No  Homicidal Thoughts:  No  Memory:  Immediate;   Good  Judgement:  Fair  Insight:  Fair  Psychomotor Activity:  Normal  Concentration:  Concentration: Fair  Recall:  [place] of Knowledge:Good  Language: Good  Akathisia:  Negative  Handed:  Right  AIMS (if indicated):  0  Assets:  Communication Skills Desire for Improvement Financial Resources/Insurance Housing Intimacy Social Support Transportation Vocational/Educational  ADL's:  Intact  Cognition: WNL  Sleep:  5-6 hours    Treatment Plan Summary: Caylan Chenard is a 31 year old female with a history of untreated sleep apnea, obesity, and a presentation of major depressive disorder, with persistent depressive features over the past 2 years. She is attempted more recently, past 2-3 months, to self medicate through use of marijuana and alcohol, but denies any history of withdrawal. I educated her on the risks of alcohol withdrawal and potential signs of withdrawal that would necessitate an emergency room visit.   She describes feelings of tearfulness, irritability, rumination, poor self-esteem, poor energy and concentration, and depressed appetite. She struggles with psychophysiologic insomnia, and early morning awakenings. She has had a fairly negative response to Prozac, with headaches, and GI upset area did we agreed to start with a more gentle approach, using Remeron nightly for sleep, appetite, mood. She is also agreeable to engaging in individual therapy, and will follow-up with this writer in 2 months. She does not present any acute safety issues at this time.    1. Obstructive sleep apnea syndrome    2. Moderate episode of recurrent major depressive disorder (HCC)    Stop Prozac 20 mg given headaches and GI intolerance; medication self tapers even long half-life Referral for CPAP titration; prior sleep study was at [facility], and she was unable to complete the CPAP titration due to insurance changes - I suspect her untreated sleep apnea is significantly contributing to her daytime headaches and depression Initiate Remeron 15 mg nightly for sleep and mood and appetite Vistaril 10-20 mg daily for anxiety Follow-up in 8 weeks Referral for individual therapy in this office   Aundra Dubin, MD 9/11/201812:11 PM

## 2017-05-24 ENCOUNTER — Other Ambulatory Visit (HOSPITAL_COMMUNITY): Payer: Self-pay | Admitting: Psychiatry

## 2017-05-24 DIAGNOSIS — G4733 Obstructive sleep apnea (adult) (pediatric): Secondary | ICD-10-CM

## 2017-05-25 ENCOUNTER — Ambulatory Visit (INDEPENDENT_AMBULATORY_CARE_PROVIDER_SITE_OTHER): Payer: Self-pay | Admitting: Obstetrics & Gynecology

## 2017-05-25 ENCOUNTER — Encounter: Payer: Self-pay | Admitting: Obstetrics & Gynecology

## 2017-05-25 VITALS — BP 134/76 | HR 70 | Wt 299.0 lb

## 2017-05-25 DIAGNOSIS — D251 Intramural leiomyoma of uterus: Secondary | ICD-10-CM

## 2017-05-25 DIAGNOSIS — N809 Endometriosis, unspecified: Secondary | ICD-10-CM

## 2017-05-25 DIAGNOSIS — D250 Submucous leiomyoma of uterus: Secondary | ICD-10-CM

## 2017-05-25 DIAGNOSIS — R232 Flushing: Secondary | ICD-10-CM

## 2017-05-25 DIAGNOSIS — R102 Pelvic and perineal pain: Secondary | ICD-10-CM

## 2017-05-25 MED ORDER — NORETHIN ACE-ETH ESTRAD-FE 1-20 MG-MCG PO TABS: 1.0000 | ORAL_TABLET | Freq: Every day | ORAL | 11 refills | Status: DC

## 2017-05-25 NOTE — Progress Notes (Signed)
History:  31 y.o. G0P0000 here today for eval of pelvic pain . Pt with a h/o endometriosis. Pt is on EES 2mg  with Provera 10mg  daily. She has started to c/o hot flushes.  She had them initially but, they resolved and now have returned.  Pt reports that the hot flushes 1-2x/ day. No  hair loss or skin changes.   Pt reports sharp stavbing pain at night main ly on the righ side. Pt takes Motrin when it occus and it goes away with resting.   She reports that these are the same sx that she had prior to her original episode of endometrioma dx.  The following portions of the patient's history were reviewed and updated as appropriate: allergies, current medications, past family history, past medical history, past social history, past surgical history and problem list.  Review of Systems:  Pertinent items are noted in HPI.   Objective:  Physical Exam Blood pressure 134/76, pulse 70, weight 299 lb (135.6 kg), last menstrual period 05/25/2017.  CONSTITUTIONAL: Well-developed, well-nourished female in no acute distress.  HENT:  Normocephalic, atraumatic EYES: Conjunctivae and EOM are normal. No scleral icterus.  NECK: Normal range of motion SKIN: Skin is warm and dry. No rash noted. Not diaphoretic.No pallor. [place]: Alert and oriented to person, place, and time. Normal coordination.  Abd: Soft, nontender and nondistended Pelvic: Normal appearing external genitalia; normal appearing vaginal mucosa and cervix.  Normal discharge.  Uterus and adnexa difficult to assess due to pts body habitus.No uterine or adnexal tenderness appreciated  Labs and Imaging No results found.  Assessment & Plan:  1. Endometriosis determined by laparoscopy  - US PELVIS (TRANSABDOMINAL ONLY); Future - US PELVIS TRANSVANGINAL NON-OB (TV ONLY); Future - norethindrone-ethinyl estradiol (LOESTRIN FE 1/20) 1-20 MG-MCG tablet; Take 1 tablet by mouth daily.  Dispense: 1 Package; Refill: 11  2. Hot flashes  - FSH - TSH -  norethindrone-ethinyl estradiol (LOESTRIN FE 1/20) 1-20 MG-MCG tablet; Take 1 tablet by mouth daily.  Dispense: 1 Package; Refill: 11  3. Pelvic pain in female  - norethindrone-ethinyl estradiol (LOESTRIN FE 1/20) 1-20 MG-MCG tablet; Take 1 tablet by mouth daily.  Dispense: 1 Package; Refill: 11  4. Intramural and submucous leiomyoma of uterus Will eval for changes on [place]  Total face-to-face time with patient was 20 min.  Greater than 50% was spent in counseling and coordination of care with the patient.    Tramayne Sebesta L. [name], M.D., Cherlynn June

## 2017-05-25 NOTE — Progress Notes (Signed)
Depression screen [facility] 2/9 05/25/2017 04/07/2017 09/19/2016 09/08/2016 06/23/2016  Decreased Interest 2 3 0 0 0  Down, Depressed, Hopeless 2 3 0 0 0  PHQ - 2 Score 4 6 0 0 0  Altered sleeping 3 3 1 2  -  Tired, decreased energy 3 3 1 2  -  Change in appetite 2 3 1 2  -  Feeling bad or failure about yourself  2 - 0 0 -  Trouble concentrating 2 1 0 0 -  Moving slowly or fidgety/restless 0 2 0 0 -  Suicidal thoughts 0 0 0 0 -  PHQ-9 Score 16 18 3 6  -   GAD 7 : Generalized Anxiety Score 05/25/2017 04/07/2017 09/19/2016 09/08/2016  Nervous, Anxious, on Edge 3 1 0 0  Control/stop worrying 3 2 0 0  Worry too much - different things 3 2 0 0  Trouble relaxing 3 2 0 1  Restless 3 1 0 0  Easily annoyed or irritable 3 3 0 0  Afraid - awful might happen 2 2 0 0  Total GAD 7 Score 20 13 0 1

## 2017-05-25 NOTE — Progress Notes (Signed)
[place] scheduled for September 19th @ 0815.  Pt notified.

## 2017-05-26 ENCOUNTER — Encounter: Payer: Self-pay | Admitting: Obstetrics & Gynecology

## 2017-05-26 LAB — FOLLICLE STIMULATING HORMONE: FSH: 18.2 m[IU]/mL

## 2017-05-26 LAB — TSH: TSH: 1.38 u[IU]/mL (ref 0.450–4.500)

## 2017-05-29 ENCOUNTER — Encounter (HOSPITAL_COMMUNITY): Payer: Self-pay | Admitting: Licensed Clinical Social Worker

## 2017-05-29 ENCOUNTER — Ambulatory Visit (INDEPENDENT_AMBULATORY_CARE_PROVIDER_SITE_OTHER): Payer: No Typology Code available for payment source | Admitting: Licensed Clinical Social Worker

## 2017-05-29 DIAGNOSIS — F411 Generalized anxiety disorder: Secondary | ICD-10-CM

## 2017-05-29 DIAGNOSIS — F1010 Alcohol abuse, uncomplicated: Secondary | ICD-10-CM

## 2017-05-29 DIAGNOSIS — F331 Major depressive disorder, recurrent, moderate: Secondary | ICD-10-CM
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
## 2017-05-29 NOTE — Progress Notes (Signed)
   THERAPIST PROGRESS NOTE  Session Time: 8:15am-9:10am  Participation Level: Active  Behavioral Response: CasualAlertDepressed  Type of Therapy: Individual Therapy  Treatment Goals addressed: Diagnosis: major depressive disorder, recurrent episode, moderate and Alcohol Use Disorder, Mild  Interventions: CBT and Motivational Interviewing  Summary: Cathy Bryan is a 31 y.o. female who presents with Major Depressive Disorder, recurrent episode, moderate, Generalized Anxiety Disorder, and Alcohol Use Disorder, Mild.  Suicidal/Homicidal: Nowithout intent/plan  Therapist Response: Clinician completed CCA and discussed treatment options. Client is in pre-contemplation stage when it comes to her drinking. However, she sees the value in individual therapy to address her depressive symptoms. Client will continue to see Dr. Daron Offer for medication management.  Plan: Return again in 1-2 weeks.  Diagnosis: Axis I: 296.32 (F33.1) Major Depressive Disorder, Recurrent episode, Moderate 300.02 (F41.1) Generalized Anxiety Disorder 305.00 (F10.10) Alcohol Use Disorder, mild    [name], LCSW 05/29/2017

## 2017-05-30 ENCOUNTER — Ambulatory Visit (HOSPITAL_COMMUNITY): Payer: No Typology Code available for payment source | Admitting: Licensed Clinical Social Worker

## 2017-05-31 ENCOUNTER — Ambulatory Visit (HOSPITAL_COMMUNITY): Payer: Self-pay

## 2017-05-31 ENCOUNTER — Ambulatory Visit (HOSPITAL_COMMUNITY)
Admission: RE | Admit: 2017-05-31 | Discharge: 2017-05-31 | Disposition: A | Discharge status: Home / Self Care | Payer: Self-pay | Source: Ambulatory Visit | Attending: Obstetrics & Gynecology | Admitting: Obstetrics & Gynecology

## 2017-05-31 DIAGNOSIS — N809 Endometriosis, unspecified: Secondary | ICD-10-CM | POA: Insufficient documentation

## 2017-05-31 DIAGNOSIS — D259 Leiomyoma of uterus, unspecified: Secondary | ICD-10-CM | POA: Insufficient documentation

## 2017-06-05 ENCOUNTER — Encounter (HOSPITAL_COMMUNITY): Payer: Self-pay | Admitting: Licensed Clinical Social Worker

## 2017-06-05 ENCOUNTER — Ambulatory Visit (INDEPENDENT_AMBULATORY_CARE_PROVIDER_SITE_OTHER): Payer: No Typology Code available for payment source | Admitting: Licensed Clinical Social Worker

## 2017-06-05 DIAGNOSIS — F411 Generalized anxiety disorder: Secondary | ICD-10-CM

## 2017-06-05 DIAGNOSIS — F331 Major depressive disorder, recurrent, moderate: Secondary | ICD-10-CM

## 2017-06-05 NOTE — Progress Notes (Signed)
   THERAPIST PROGRESS NOTE  Session Time: 2:30pm-3:30pm  Participation Level: Active  Behavioral Response: NeatAlertDepressed  Type of Therapy: Individual Therapy  Treatment Goals addressed: Diagnosis: major depressive disorder, recurrent episode, moderate and Alcohol Use Disorder, Mild  Interventions: CBT and Motivational Interviewing  Summary: Cathy Bryan is a 31 y.o. female who presents with Major Depressive Disorder, recurrent episode, moderate, Generalized Anxiety Disorder, and Alcohol Use Disorder, Mild.  Suicidal/Homicidal: Nowithout intent/plan  Therapist Response: Cathy Bryan engaged well in individual session. Clinician worked with Cathy Bryan to create treatment plan, noting need for coping skills in dealing with depression, anxiety, and relationship. Clinician processed relationship with [place] and discussed the need to be more vocal about her wants and needs, as well as her future plans. Cathy Bryan responded well to MI and CBT.   Plan: Return again in 1-2 weeks.  Diagnosis:      Axis I: 296.32 (F33.1) Major Depressive Disorder, Recurrent episode, Moderate 300.02 (F41.1) Generalized Anxiety Disorder 305.00 (F10.10) Alcohol Use Disorder, mild    [name] 06/05/2017

## 2017-06-06 ENCOUNTER — Encounter: Payer: Self-pay | Admitting: Obstetrics & Gynecology

## 2017-06-12 MED FILL — LARIN FE 1-20 TABLET: 1-20 | 28 days supply | Qty: 28 | Fill #0

## 2017-06-13 ENCOUNTER — Ambulatory Visit (INDEPENDENT_AMBULATORY_CARE_PROVIDER_SITE_OTHER): Payer: No Typology Code available for payment source | Admitting: Licensed Clinical Social Worker

## 2017-06-13 ENCOUNTER — Encounter (HOSPITAL_COMMUNITY): Payer: Self-pay | Admitting: Licensed Clinical Social Worker

## 2017-06-13 DIAGNOSIS — F1099 Alcohol use, unspecified with unspecified alcohol-induced disorder: Secondary | ICD-10-CM

## 2017-06-13 DIAGNOSIS — F331 Major depressive disorder, recurrent, moderate: Secondary | ICD-10-CM

## 2017-06-13 DIAGNOSIS — F411 Generalized anxiety disorder: Secondary | ICD-10-CM

## 2017-06-13 NOTE — Progress Notes (Signed)
   THERAPIST PROGRESS NOTE  Session Time: 3:30pm-4:30pm  Participation Level: Active  Behavioral Response: Well GroomedAlertDepressed  Type of Therapy: Individual Therapy   Treatment Goals addressed: Diagnosis: major depressive disorder, recurrent episode, moderate and Alcohol Use Disorder, Mild   Interventions: CBT and Motivational Interviewing   Summary: Cathy Bryan is a 31 y.o. female who presents with Major Depressive Disorder, recurrent episode, moderate, Generalized Anxiety Disorder, and Alcohol Use Disorder, Mild.   Suicidal/Homicidal: Nowithout intent/plan   Therapist Response: Miara engaged well in session and discussed interactions with friends and sorority sisters. Cathy Bryan processed thoughts and feelings about miscommunication and being mistreated by those whom she trusted. Clinician reflected thoughts and feelings, identifying disappointment in others. Clinician also noted the importance of building some boundaries with people in order to protect herself somewhat. Clinician identified high levels of sensitivity, especially due to current depressive episode, and noted the strength and weakness in this character trait.   Plan: Return again in 1-2 weeks.   Diagnosis:      Axis I: 296.32 (F33.1) Major Depressive Disorder, Recurrent episode, Moderate 300.02 (F41.1) Generalized Anxiety Disorder 305.00 (F10.10) Alcohol Use Disorder, mild   [name], LCSW 06/13/2017

## 2017-06-20 ENCOUNTER — Encounter (HOSPITAL_COMMUNITY): Payer: Self-pay | Admitting: Licensed Clinical Social Worker

## 2017-06-20 ENCOUNTER — Ambulatory Visit (INDEPENDENT_AMBULATORY_CARE_PROVIDER_SITE_OTHER): Payer: No Typology Code available for payment source | Admitting: Licensed Clinical Social Worker

## 2017-06-20 DIAGNOSIS — F411 Generalized anxiety disorder: Secondary | ICD-10-CM

## 2017-06-20 DIAGNOSIS — F1010 Alcohol abuse, uncomplicated: Secondary | ICD-10-CM

## 2017-06-20 DIAGNOSIS — F331 Major depressive disorder, recurrent, moderate: Secondary | ICD-10-CM

## 2017-06-20 NOTE — Progress Notes (Signed)
   THERAPIST PROGRESS NOTE  Session Time: 3:30pm-4:30pm  Participation Level: Active  Behavioral Response: Well GroomedAlertDepressed  Type of Therapy: Individual Therapy   Treatment Goals addressed: Diagnosis: major depressive disorder, recurrent episode, moderate and Alcohol Use Disorder, Mild   Interventions: CBT and Motivational Interviewing   Summary: Cathy Bryan is a 31 y.o. female who presents with Major Depressive Disorder, recurrent episode, moderate, Generalized Anxiety Disorder, and Alcohol Use Disorder, Mild.   Suicidal/Homicidal: Nowithout intent/plan   Therapist Response: Cathy Bryan engaged well in session and discussed ongoing difficulty dealing with sorority sisters. Cathy Bryan explained current status and interactions with these women. Cathy Bryan also identified increased depressive thoughts and mood when having to deal with these women. Cathy Bryan noted this was a major source of stress for her at this time and she wanted to leave the sorority. Clinician utilized CBT Cost/Benefits Analysis in order to assist Cathy Bryan with conceptualizing her choices. Cathy Bryan also discussed concerns about her relationship and whether or not to introduce her "friend" to her parents. Clinician processed past interactions with parents related to her choice in partners and her sexual identity. Cathy Bryan noted that her religion made it very difficult for her parents to understand her lifestyle.   Plan: Return again in 1-2 weeks.   Diagnosis:      Axis I: 296.32 (F33.1) Major Depressive Disorder, Recurrent episode, Moderate 300.02 (F41.1) Generalized Anxiety Disorder 305.00 (F10.10) Alcohol Use Disorder, mild     [name], LCSW 06/20/2017

## 2017-06-26 ENCOUNTER — Encounter (HOSPITAL_BASED_OUTPATIENT_CLINIC_OR_DEPARTMENT_OTHER): Payer: Self-pay

## 2017-06-26 MED FILL — ?MIRTAZAPINE 15 MG TABLET: 15 | 30 days supply | Qty: 30 | Fill #1

## 2017-06-26 MED FILL — LOSARTAN-HCTZ 100-25 MG TAB: 100-25 | 30 days supply | Qty: 30 | Fill #2

## 2017-07-01 ENCOUNTER — Ambulatory Visit (HOSPITAL_BASED_OUTPATIENT_CLINIC_OR_DEPARTMENT_OTHER): Payer: No Typology Code available for payment source | Attending: Psychiatry | Admitting: Internal Medicine

## 2017-07-01 VITALS — Ht 67.0 in | Wt 304.0 lb

## 2017-07-01 DIAGNOSIS — G4733 Obstructive sleep apnea (adult) (pediatric): Secondary | ICD-10-CM

## 2017-07-04 ENCOUNTER — Ambulatory Visit: Payer: Self-pay | Attending: Family Medicine | Admitting: Family Medicine

## 2017-07-04 ENCOUNTER — Encounter: Payer: Self-pay | Admitting: Family Medicine

## 2017-07-04 VITALS — BP 141/79 | HR 91 | Temp 98.0°F | Ht 67.0 in | Wt 301.2 lb

## 2017-07-04 DIAGNOSIS — I10 Essential (primary) hypertension: Secondary | ICD-10-CM | POA: Insufficient documentation

## 2017-07-04 DIAGNOSIS — M5441 Lumbago with sciatica, right side: Secondary | ICD-10-CM | POA: Insufficient documentation

## 2017-07-04 DIAGNOSIS — Z9889 Other specified postprocedural states: Secondary | ICD-10-CM | POA: Insufficient documentation

## 2017-07-04 DIAGNOSIS — N809 Endometriosis, unspecified: Secondary | ICD-10-CM | POA: Insufficient documentation

## 2017-07-04 DIAGNOSIS — M62838 Other muscle spasm: Secondary | ICD-10-CM | POA: Insufficient documentation

## 2017-07-04 DIAGNOSIS — Z79899 Other long term (current) drug therapy: Secondary | ICD-10-CM | POA: Insufficient documentation

## 2017-07-04 DIAGNOSIS — E669 Obesity, unspecified: Secondary | ICD-10-CM | POA: Insufficient documentation

## 2017-07-04 DIAGNOSIS — F329 Major depressive disorder, single episode, unspecified: Secondary | ICD-10-CM | POA: Insufficient documentation

## 2017-07-04 DIAGNOSIS — F419 Anxiety disorder, unspecified: Secondary | ICD-10-CM | POA: Insufficient documentation

## 2017-07-04 DIAGNOSIS — M5431 Sciatica, right side: Secondary | ICD-10-CM

## 2017-07-04 MED ORDER — MELOXICAM 7.5 MG PO TABS: 7.5000 mg | ORAL_TABLET | Freq: Every day | ORAL | 1 refills | Status: DC

## 2017-07-04 MED ORDER — CYCLOBENZAPRINE HCL 10 MG PO TABS: 10.0000 mg | ORAL_TABLET | Freq: Two times a day (BID) | ORAL | 1 refills | Status: DC | PRN

## 2017-07-04 MED ORDER — KETOROLAC TROMETHAMINE 60 MG/2ML IM SOLN
60.0000 mg | Freq: Once | INTRAMUSCULAR | Status: AC
  Administered 2017-07-04: 60 mg via INTRAMUSCULAR

## 2017-07-04 NOTE — Patient Instructions (Signed)
Sciatica Sciatica is pain, numbness, weakness, or tingling along the path of the sciatic nerve. The sciatic nerve starts in the lower back and runs down the back of each leg. The nerve controls the muscles in the lower leg and in the back of the knee. It also provides feeling (sensation) to the back of the thigh, the lower leg, and the sole of the foot. Sciatica is a symptom of another medical condition that pinches or puts pressure on the sciatic nerve. Generally, sciatica only affects one side of the body. Sciatica usually goes away on its own or with treatment. In some cases, sciatica may keep coming back (recur). What are the causes? This condition is caused by pressure on the sciatic nerve, or pinching of the sciatic nerve. This may be the result of:  A disk in between the bones of the spine (vertebrae) bulging out too far (herniated disk).  Age-related changes in the spinal disks (degenerative disk disease).  A pain disorder that affects a muscle in the buttock (piriformis syndrome).  Extra bone growth (bone spur) near the sciatic nerve.  An injury or break (fracture) of the pelvis.  Pregnancy.  Tumor (rare). What increases the risk? The following factors may make you more likely to develop this condition:  Playing sports that place pressure or stress on the spine, such as football or weight lifting.  Having poor strength and flexibility.  A history of back injury.  A history of back surgery.  Sitting for long periods of time.  Doing activities that involve repetitive bending or lifting.  Obesity. What are the signs or symptoms? Symptoms can vary from mild to very severe, and they may include:  Any of these problems in the lower back, leg, hip, or buttock:  Mild tingling or dull aches.  Burning sensations.  Sharp pains.  Numbness in the back of the calf or the sole of the foot.  Leg weakness.  Severe back pain that makes movement difficult. These symptoms may  get worse when you cough, sneeze, or laugh, or when you sit or stand for long periods of time. Being overweight may also make symptoms worse. In some cases, symptoms may recur over time. How is this diagnosed? This condition may be diagnosed based on:  Your symptoms.  A physical exam. Your health care provider may ask you to do certain movements to check whether those movements trigger your symptoms.  You may have tests, including:  Blood tests.  X-rays.  MRI.  CT scan. How is this treated? In many cases, this condition improves on its own, without any treatment. However, treatment may include:  Reducing or modifying physical activity during periods of pain.  Exercising and stretching to strengthen your abdomen and improve the flexibility of your spine.  Icing and applying heat to the affected area.  Medicines that help:  To relieve pain and swelling.  To relax your muscles.  Injections of medicines that help to relieve pain, irritation, and inflammation around the sciatic nerve (steroids).  Surgery. Follow these instructions at home: Medicines   Take over-the-counter and prescription medicines only as told by your health care provider.  Do not drive or operate heavy machinery while taking prescription pain medicine. Managing pain   If directed, apply ice to the affected area.  Put ice in a plastic bag.  Place a towel between your skin and the bag.  Leave the ice on for 20 minutes, 2-3 times a day.  After icing, apply heat to the   affected area before you exercise or as often as told by your health care provider. Use the heat source that your health care provider recommends, such as a moist heat pack or a heating pad.  Place a towel between your skin and the heat source.  Leave the heat on for 20-30 minutes.  Remove the heat if your skin turns bright red. This is especially important if you are unable to feel pain, heat, or cold. You may have a greater risk of  getting burned. Activity   Return to your normal activities as told by your health care provider. Ask your health care provider what activities are safe for you.  Avoid activities that make your symptoms worse.  Take brief periods of rest throughout the day. Resting in a lying or standing position is usually better than sitting to rest.  When you rest for longer periods, mix in some mild activity or stretching between periods of rest. This will help to prevent stiffness and pain.  Avoid sitting for long periods of time without moving. Get up and move around at least one time each hour.  Exercise and stretch regularly, as told by your health care provider.  Do not lift anything that is heavier than 10 lb (4.5 kg) while you have symptoms of sciatica. When you do not have symptoms, you should still avoid heavy lifting, especially repetitive heavy lifting.  When you lift objects, always use proper lifting technique, which includes:  Bending your knees.  Keeping the load close to your body.  Avoiding twisting. General instructions   Use good posture.  Avoid leaning forward while sitting.  Avoid hunching over while standing.  Maintain a healthy weight. Excess weight puts extra stress on your back and makes it difficult to maintain good posture.  Wear supportive, comfortable shoes. Avoid wearing high heels.  Avoid sleeping on a mattress that is too soft or too hard. A mattress that is firm enough to support your back when you sleep may help to reduce your pain.  Keep all follow-up visits as told by your health care provider. This is important. Contact a health care provider if:  You have pain that wakes you up when you are sleeping.  You have pain that gets worse when you lie down.  Your pain is worse than you have experienced in the past.  Your pain lasts longer than 4 weeks.  You experience unexplained weight loss. Get help right away if:  You lose control of your bowel  or bladder (incontinence).  You have:  Weakness in your lower back, pelvis, buttocks, or legs that gets worse.  Redness or swelling of your back.  A burning sensation when you urinate. This information is not intended to replace advice given to you by your health care provider. Make sure you discuss any questions you have with your health care provider. Document Released: 08/23/2001 Document Revised: 02/02/2016 Document Reviewed: 05/08/2015 Elsevier Interactive Patient Education  2017 [employer] Inc.  

## 2017-07-04 NOTE — Progress Notes (Signed)
Subjective:  Patient ID: Cathy Bryan, female    DOB: 1986-02-13  Age: 31 y.o. MRN: [id number]  CC: Back Pain   HPI Cathy Bryan is a 31 year old female with a history of Obesity, hypertension, bilateral endometrioma status post exploratory laparotomy with bilateral oophorectomy and lysis of adhesions, anxiety and depression (managed by behavioral health) who presents today with a 3 day history of low back pain which radiates down both lower extremities. She denies history of trauma but endorses prolonged standing for the last 2 weeks. Pain is described as 10/10 she denies numbness in extremities or loss of sphincteric function. Use of OTC analgesics have not helped. She has also applied heat with no improvement in symptoms.    Past Medical History:  Diagnosis Date  . Anxiety   . Depression   . Endometriosis   . Hypertension     Past Surgical History:  Procedure Laterality Date  . APPENDECTOMY  04/19/2016   Procedure: APPENDECTOMY;  Surgeon: Lavonia Drafts, MD;  Location: [facility];  Service: Gynecology;;  . LAPAROTOMY Right 04/19/2016   Procedure: EXPLORATORY LAPAROTOMY WITH BILATERAL OOPHORECTOMY  LYSIS OF ADHESIONS;  Surgeon: Lavonia Drafts, MD;  Location: [facility];  Service: Gynecology;  Laterality: Right;    No Known Allergies   Outpatient Medications Prior to Visit  Medication Sig Dispense Refill  . hydrOXYzine (ATARAX/VISTARIL) 10 MG tablet Take 1 tablet (10 mg total) by mouth 2 (two) times daily as needed for anxiety (panic). 30 tablet 2  . losartan-hydrochlorothiazide (HYZAAR) 100-25 MG tablet Take 1 tablet by mouth daily. 30 tablet 5  . mirtazapine (REMERON) 15 MG tablet Take 1 tablet (15 mg total) by mouth at bedtime. 30 tablet 2  . norethindrone-ethinyl estradiol (LOESTRIN FE 1/20) 1-20 MG-MCG tablet Take 1 tablet by mouth daily. 1 Package 11  . cetirizine (ZYRTEC) 10 MG tablet Take 1 tablet (10 mg total) by mouth daily. (Patient not taking: Reported on  07/04/2017) 30 tablet 3   No facility-administered medications prior to visit.     ROS Review of Systems  Constitutional: Negative for activity change, appetite change and fatigue.  HENT: Negative for congestion, sinus pressure and sore throat.   Eyes: Negative for visual disturbance.  Respiratory: Negative for cough, chest tightness, shortness of breath and wheezing.   Cardiovascular: Negative for chest pain and palpitations.  Gastrointestinal: Negative for abdominal distention, abdominal pain and constipation.  Endocrine: Negative for polydipsia.  Genitourinary: Negative for dysuria and frequency.  Musculoskeletal: Positive for back pain. Negative for arthralgias.  Skin: Negative for rash.  Neurological: Negative for tremors, light-headedness and numbness.  Hematological: Does not bruise/bleed easily.  Psychiatric/Behavioral: Negative for agitation and behavioral problems.    Objective:  BP (!) 141/79   Pulse 91   Temp 98 F (36.7 C) (Oral)   Ht 5\' 7"  (1.702 m)   Wt (!) 301 lb 3.2 oz (136.6 kg)   SpO2 92%   BMI 47.17 kg/m   BP/Weight 07/04/2017 07/01/2017 5/63/1497  Systolic BP 026 - 378  Diastolic BP 79 - 76  Wt. (Lbs) 301.2 304 299  BMI 47.17 47.61 46.83      Physical Exam  Constitutional: She is oriented to person, place, and time. She appears well-developed and well-nourished.  Cardiovascular: Normal rate, normal heart sounds and intact distal pulses.   No murmur heard. Pulmonary/Chest: Effort normal and breath sounds normal. She has no wheezes. She has no rales. She exhibits no tenderness.  Abdominal: Soft. Bowel sounds are normal. She exhibits no  distension and no mass. There is no tenderness.  Musculoskeletal: Normal range of motion. She exhibits tenderness (tenderness on palpation of right paraspinal muscle; positive straight leg raise on the right ).  Neurological: She is alert and oriented to person, place, and time.     Assessment & Plan:   1.  Sciatica of right side Placed on meloxicam and Flexeril Discussed sedating side effects of Flexeril Apply heat We'll reassess at next visit  2. Muscle spasm Demonstrated back exercises Could also be secondary to positioning from recent prolonged standing. Intramuscular Toradol 60 mg administered  Meds ordered this encounter  Medications  . cyclobenzaprine (FLEXERIL) 10 MG tablet    Sig: Take 1 tablet (10 mg total) by mouth 2 (two) times daily as needed for muscle spasms.    Dispense:  60 tablet    Refill:  1  . meloxicam (MOBIC) 7.5 MG tablet    Sig: Take 1 tablet (7.5 mg total) by mouth daily.    Dispense:  30 tablet    Refill:  1    Follow-up: Return in about 6 weeks (around 08/15/2017) for Follow-up on sciatica.   Arnoldo Morale MD

## 2017-07-05 ENCOUNTER — Encounter: Payer: Self-pay | Admitting: Obstetrics & Gynecology

## 2017-07-09 DIAGNOSIS — G4733 Obstructive sleep apnea (adult) (pediatric): Secondary | ICD-10-CM

## 2017-07-09 NOTE — Procedures (Signed)
Patient Name: Cathy Bryan, Cathy Bryan Date: 07/01/2017 Gender: Female D.O.B: [birth year] Age (years): 31 Referring Provider: Lulu Riding Height (inches): 64 Interpreting Physician: Baird Lyons MD, ABSM Weight (lbs): 304 RPSGT: [place], [place] BMI: 48 MRN: [id number] Neck Size: 16.00 CLINICAL INFORMATION Sleep Study Type: Split Night CPAP  Indication for sleep study: OSA  Epworth Sleepiness Score: 10  SLEEP STUDY TECHNIQUE As per the AASM Manual for the Scoring of Sleep and Associated Events v2.3 (April 2016) with a hypopnea requiring 4% desaturations.  The channels recorded and monitored were frontal, central and occipital EEG, electrooculogram (EOG), submentalis EMG (chin), nasal and oral airflow, thoracic and abdominal wall motion, anterior tibialis EMG, snore microphone, electrocardiogram, and pulse oximetry. Continuous positive airway pressure (CPAP) was initiated when the patient met split night criteria and was titrated according to treat sleep-disordered breathing.  MEDICATIONS Medications self-administered by patient taken the night of the study : REMERON  RESPIRATORY PARAMETERS Diagnostic  Total AHI (/hr): 14.9 RDI (/hr): 19.5 OA Index (/hr): 2.7 CA Index (/hr): 0.8 REM AHI (/hr): 81.1 NREM AHI (/hr): 6.5 Supine AHI (/hr): 46.2 Non-supine AHI (/hr): 14.68 Min O2 Sat (%): 74.00 Mean O2 (%): 93.38 Time below 88% (min): 9.6   Titration  Optimal Pressure (cm): 8 AHI at Optimal Pressure (/hr): 2.0 Min O2 at Optimal Pressure (%): 92.0 Supine % at Optimal (%): 53 Sleep % at Optimal (%): 94    SLEEP ARCHITECTURE The recording time for the entire night was 409.7 minutes.  During a baseline period of 193.2 minutes, the patient slept for 156.6 minutes in REM and nonREM, yielding a sleep efficiency of 81.1%. Sleep onset after lights out was 26.1 minutes with a REM latency of 87.0 minutes. The patient spent 2.24% of the night in stage N1 sleep, 55.89% in stage N2 sleep,  30.06% in stage N3 and 11.82% in REM.  During the titration period of 210.8 minutes, the patient slept for 204.8 minutes in REM and nonREM, yielding a sleep efficiency of 97.2%. Sleep onset after CPAP initiation was 0.0 minutes with a REM latency of 2.2 minutes. The patient spent 4.99% of the night in stage N1 sleep, 53.51% in stage N2 sleep, 9.28% in stage N3 and 32.23% in REM.  CARDIAC DATA The 2 lead EKG demonstrated sinus rhythm. The mean heart rate was 83.33 beats per minute. Other EKG findings include: PVCs.  LEG MOVEMENT DATA The total Periodic Limb Movements of Sleep (PLMS) were 0. The PLMS index was 0.00 .  IMPRESSIONS - Mild to moderate obstructive sleep apnea occurred during the diagnostic portion of the study (AHI = 14.9 /hour). An optimal PAP pressure was selected for this patient ( 8 cm of water) - No significant central sleep apnea occurred during the diagnostic portion of the study (CAI = 0.8/hour). - Mild oxygen desaturation was noted during the diagnostic portion of the study (Min O2 = 74.00%). Minimal O2 saturation on CPAP 8 was 92%. - The patient snored with loud snoring volume during the diagnostic portion of the study. - EKG findings include PVCs. - Clinically significant periodic limb movements did not occur during sleep.  DIAGNOSIS - Obstructive Sleep Apnea (327.23 [G47.33 ICD-10])  RECOMMENDATIONS - Trial of CPAP therapy on 8 cm H2O with a Medium size Philips Respironics Nasal Pillow Mask Nuance Pro Gel mask and heated humidification. - Avoid alcohol, sedatives and other CNS depressants that may worsen sleep apnea and disrupt normal sleep architecture. - Sleep hygiene should be reviewed to assess factors that may improve  sleep quality. - Weight management and regular exercise should be initiated or continued.  [Electronically signed] 07/09/2017 05:31 PM  Baird Lyons MD, [place], American Board of Sleep Medicine   NPI: [id number] [place], [place] [facility]  ELECTRONICALLY SIGNED ON:  07/09/2017, 5:28 PM [place] PH: [phone]   FX: [phone] [place]

## 2017-07-11 NOTE — Progress Notes (Signed)
Hey regan, did we get a fax for this patient's CPAP to go ahead and order her mask?

## 2017-07-13 ENCOUNTER — Ambulatory Visit (INDEPENDENT_AMBULATORY_CARE_PROVIDER_SITE_OTHER): Payer: No Typology Code available for payment source | Admitting: Licensed Clinical Social Worker

## 2017-07-13 ENCOUNTER — Encounter (HOSPITAL_COMMUNITY): Payer: Self-pay | Admitting: Licensed Clinical Social Worker

## 2017-07-13 DIAGNOSIS — F411 Generalized anxiety disorder: Secondary | ICD-10-CM

## 2017-07-13 DIAGNOSIS — F1010 Alcohol abuse, uncomplicated: Secondary | ICD-10-CM

## 2017-07-13 DIAGNOSIS — F331 Major depressive disorder, recurrent, moderate: Secondary | ICD-10-CM
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
## 2017-07-20 ENCOUNTER — Encounter (HOSPITAL_BASED_OUTPATIENT_CLINIC_OR_DEPARTMENT_OTHER): Payer: Self-pay

## 2017-07-21 IMAGING — MR MR PELVIS WO/W CM
4 of 9 series · 19 of 48 positions shown · IV contrast (multihance)
Comparison: Pelvic ultrasound on 12/09/2015

CLINICAL DATA: Pelvic pain. Large complex adnexal mass seen on
recent ultrasound. Fibroids.

EXAM:
MRI PELVIS WITHOUT AND WITH CONTRAST
TECHNIQUE: Multiplanar multisequence MR imaging of the pelvis was performed
both before and after administration of intravenous contrast.
CONTRAST:  20mL MULTIHANCE GADOBENATE DIMEGLUMINE 529 MG/ML IV SOLN

[Series 3: T2 · coronal · 5.0mm · 0.59mm/px · 7 of 36 slices shown (1 of 3)]
[im 1/36]
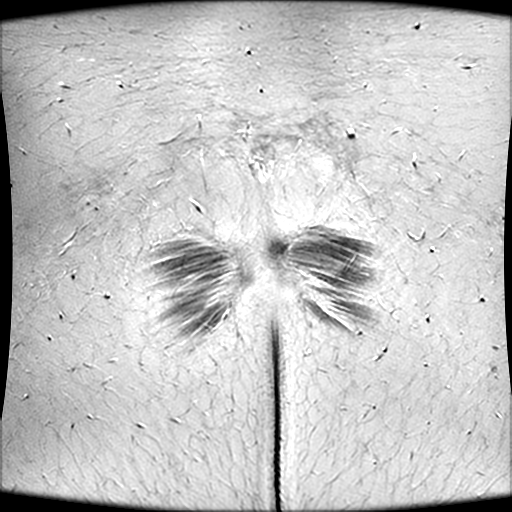
[im 6/36]
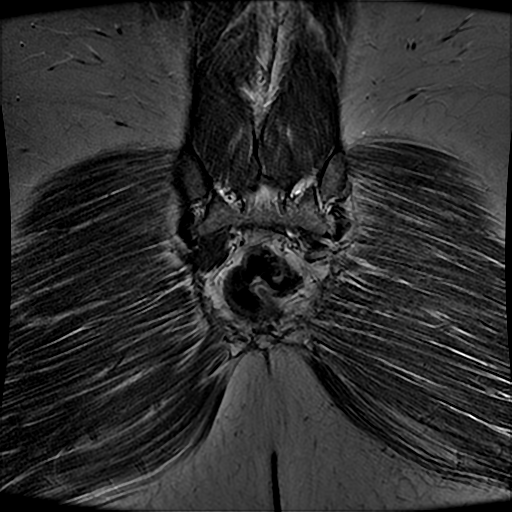
[im 12/36]
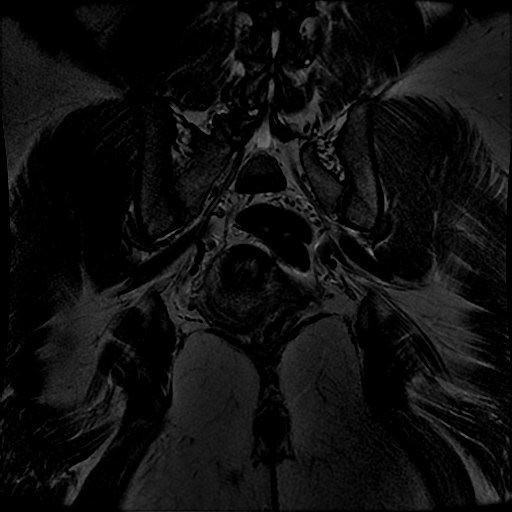
[im 18/36]
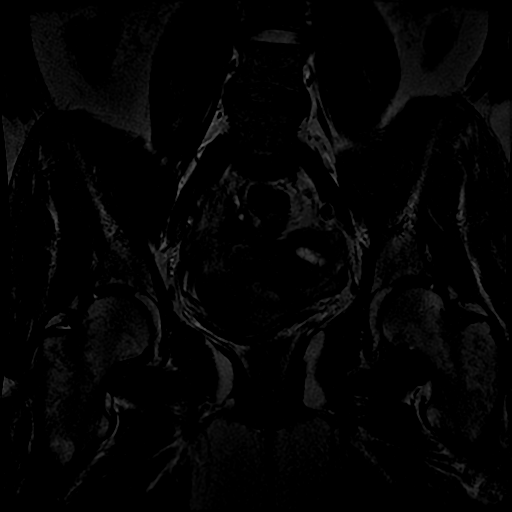
[im 24/36]
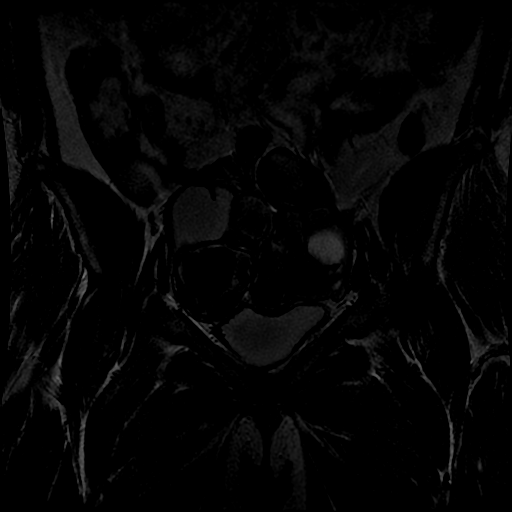
[im 30/36]
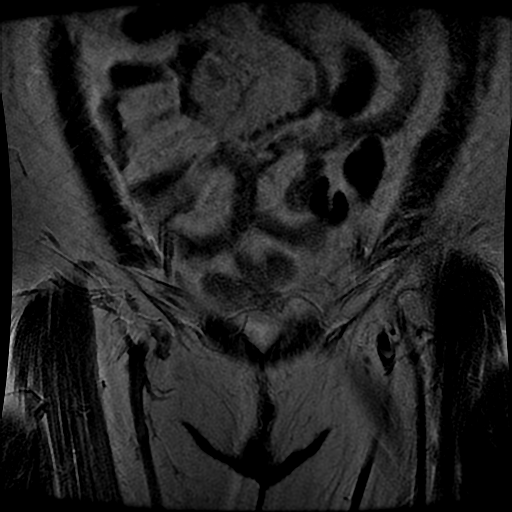
[im 36/36]
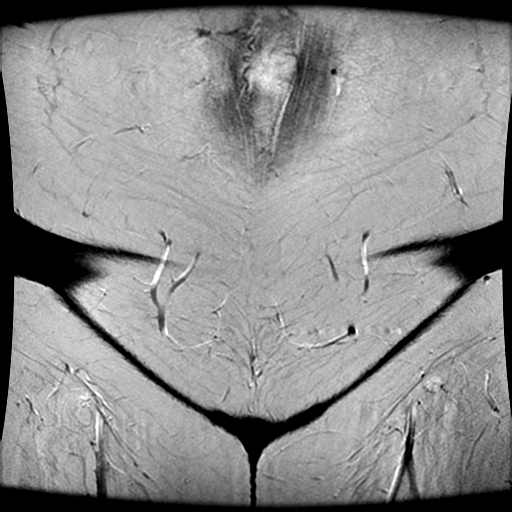

[Series 4: T2 · sagittal · 5.0mm · 0.51mm/px · 5 of 32 slices shown (2 of 3)]
[im 1/32]
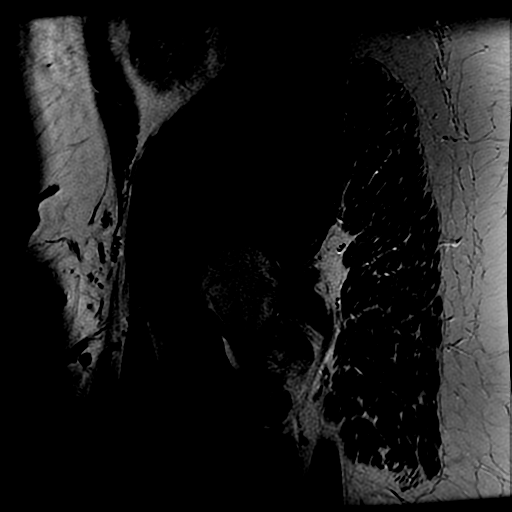
[im 8/32]
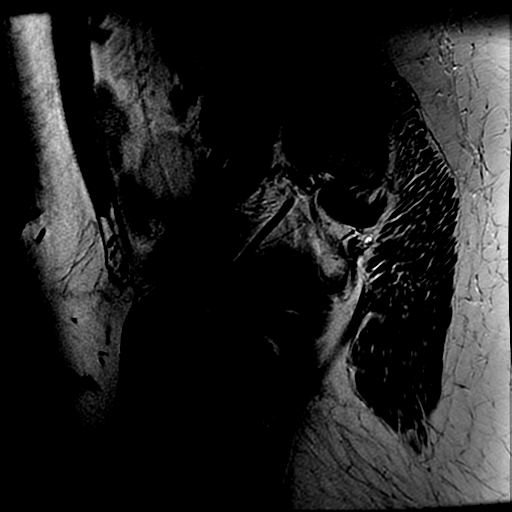
[im 16/32]
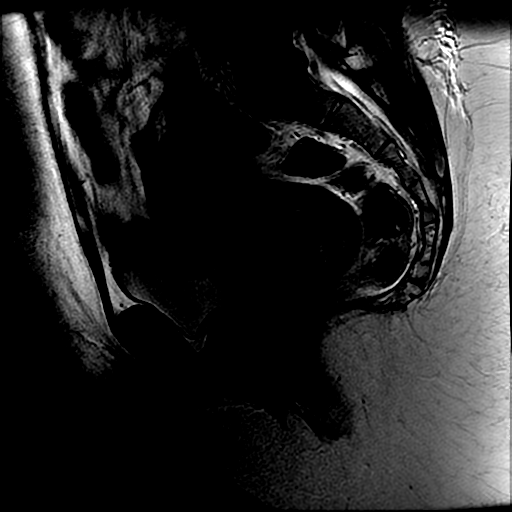
[im 24/32]
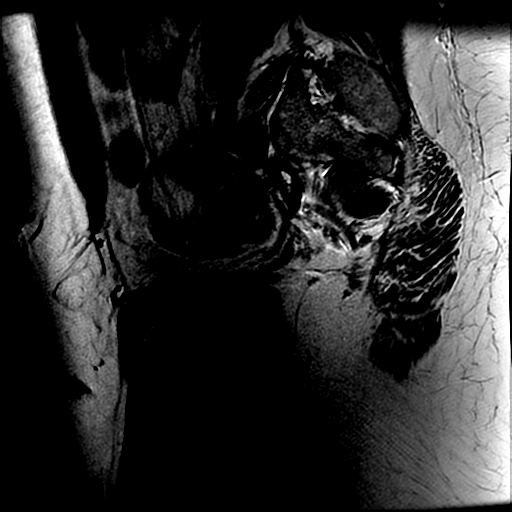
[im 32/32]
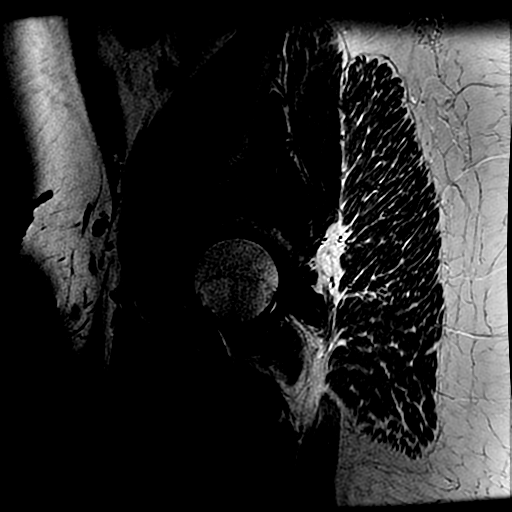

[Series 5: T2 · axial · 5.0mm · 0.51mm/px · z∈[+4,+202]mm · 4 of 34 slices shown (3 of 3)]
[im 1/34]
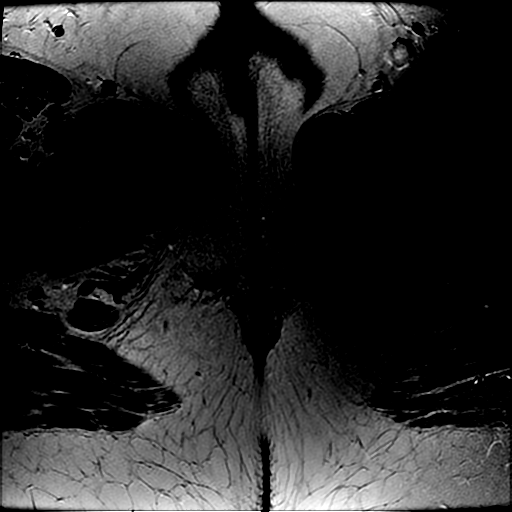
[im 9/34]
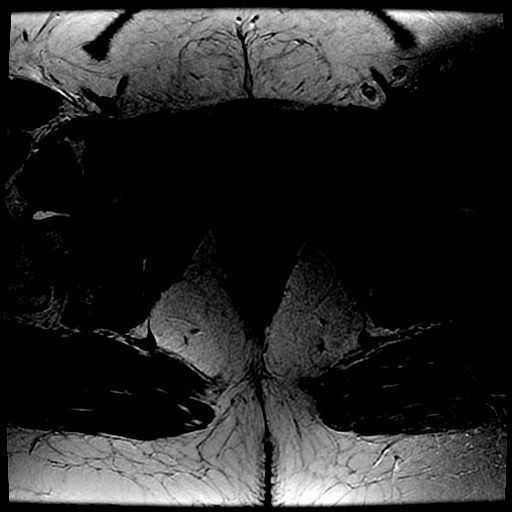
[im 17/34]
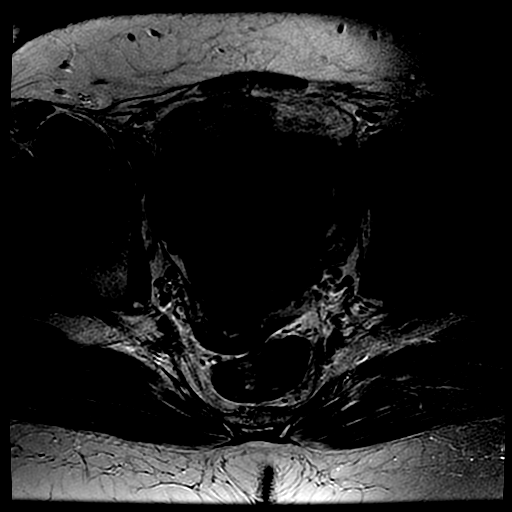
[im 34/34]
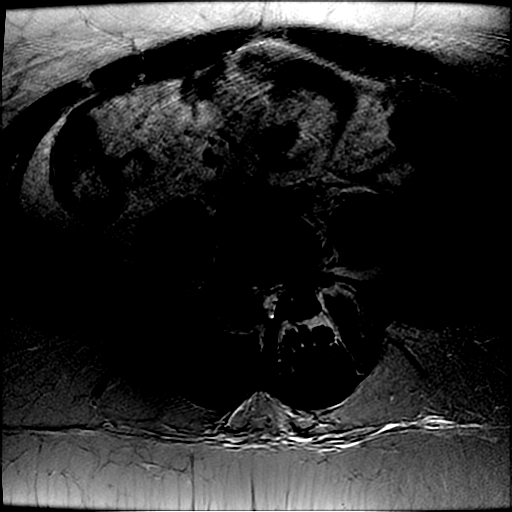

[Series 6: T2 fat-sat · axial · 5.0mm · 0.51mm/px · z∈[+4,+202]mm · 3 of 34 slices shown]
[im 1/34]
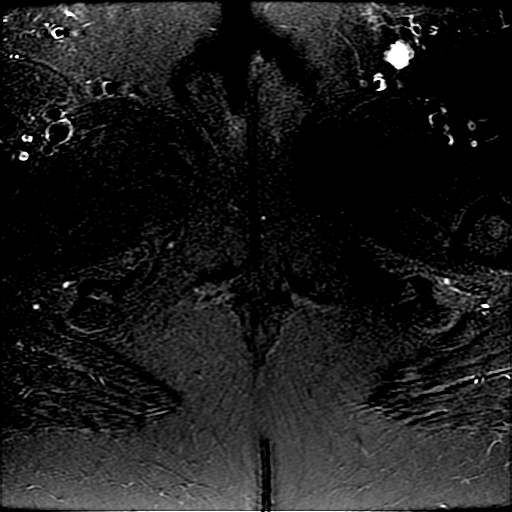
[im 17/34]
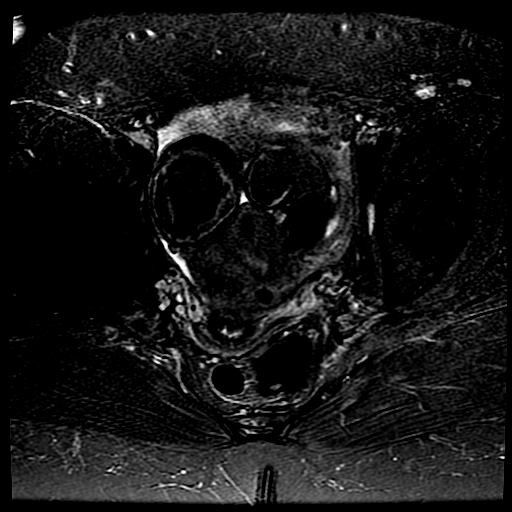
[im 34/34]
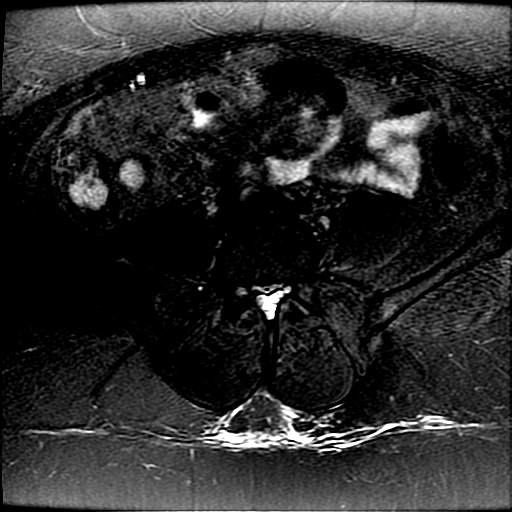

[19 of 48 positions shown; findings below may reference images not displayed]

FINDINGS: Urinary Tract:  Bladder appears normal.

Bowel:  Unremarkable visualized pelvic bowel loops.

Vascular/Lymphatic: No pathologically enlarged lymph nodes. No
significant vascular abnormality seen.

Uterus: Measures 7.6 x 5.7 x 6.3 cm. Several small intramural
fibroids are seen which range in size from less than 1 cm the
largest in the right anterior corpus measuring 3.5 cm and showing
lack of contrast enhancement due to internal degeneration.

Endometrium:  Unremarkable.

Cervix/Vagina:  Appears normal.

Right ovary:  Not well visualized.

Left ovary:  Not well visualized.

Other: A large complex cystic mass is seen within the central pelvis
superior to the uterus which involves both adnexal regions. This
measures 7.3 x 11.0 by 9.1 cm. This lesion shows thickened
septations, with multiple cystic components demonstrating T1
hyperintensity and low T2 signal intensity shading. No internal fat
is seen. These findings favor a large endometrioma. Mucinous cystic
ovarian neoplasm is considered less likely but cannot definitely be
excluded. No evidence of ascites.

Musculoskeletal:  Unremarkable.
IMPRESSION: 11 cm complex cystic mass in the central pelvis superior to the
uterus, which involves both adnexal regions. MR characteristics
favor a large endometrioma. Mucinous cystic ovarian neoplasm is
considered less likely, but cannot definitely be excluded. Surgical
evaluation should be considered.

Small uterine fibroids measuring up to 3.5 cm.

## 2017-07-24 ENCOUNTER — Encounter (HOSPITAL_COMMUNITY): Payer: Self-pay | Admitting: Psychiatry

## 2017-07-24 ENCOUNTER — Ambulatory Visit (INDEPENDENT_AMBULATORY_CARE_PROVIDER_SITE_OTHER): Payer: No Typology Code available for payment source | Admitting: Psychiatry

## 2017-07-24 DIAGNOSIS — R4584 Anhedonia: Secondary | ICD-10-CM

## 2017-07-24 DIAGNOSIS — F1210 Cannabis abuse, uncomplicated: Secondary | ICD-10-CM

## 2017-07-24 DIAGNOSIS — F331 Major depressive disorder, recurrent, moderate: Secondary | ICD-10-CM

## 2017-07-24 MED ORDER — MIRTAZAPINE 45 MG PO TABS: 45.0000 mg | ORAL_TABLET | Freq: Every day | ORAL | 1 refills | Status: AC

## 2017-07-24 MED ORDER — MIRTAZAPINE 30 MG PO TABS: ORAL_TABLET | ORAL | 0 refills | Status: DC

## 2017-07-24 MED FILL — MIRTAZAPINE 30 MG TABLET: 30 | 7 days supply | Qty: 14 | Fill #0

## 2017-07-24 NOTE — Progress Notes (Signed)
[place] MD/PA/NP OP Progress Note  07/24/2017 11:36 AM Cathy Bryan  MRN:  [id number]  Chief Complaint:  Depression  HPI: Cathy Bryan reports that she is sleeping better with Remeron, but continues to feel depressed.  She is tearful, reflecting on relationship stressors, and poor sense of self worth.  She has trouble getting motivated to complete her schooling, and feels ongoing sense of anhedonia.  We agreed to increase Remeron to 30 mg x 2 weeks, then 45 mg thereafter.  Spent time educating the patient on her sleep study results.  She does not have the financial ability to afford CPAP machine at this time.  She does have financial assistance, and encouraged her to reach out to sleep clinic, as will I, to see how we can support her getting a CPAP machine.  I educated her about the medical risks of sleep apnea.  I encouraged her to reduce and minimize her alcohol intake, she continues to drink 4-5 shots of alcohol in the evenings after work and school.  She denies any suicidal thoughts and agrees to follow-up in the therapy and med management with this Probation officer.  She reports that therapy has also been helpful, and she will continue in this.  Visit Diagnosis:    ICD-10-CM   1. Moderate episode of recurrent major depressive disorder (HCC) F33.1 mirtazapine (REMERON) 30 MG tablet    mirtazapine (REMERON) 45 MG tablet    Past Psychiatric History: See intake H&P for full details. Reviewed, with no updates at this time.   Past Medical History:  Past Medical History:  Diagnosis Date  . Anxiety   . Depression   . Endometriosis   . Hypertension    History reviewed. No pertinent surgical history.  Family Psychiatric History: See intake H&P for full details. Reviewed, with no updates at this time.   Family History:  Family History  Problem Relation Age of Onset  . Hypertension Mother   . Hypertension Father   . Diabetes Father     Social History:  Social History   Socioeconomic History  .  Marital status: Single    Spouse name: None  . Number of children: None  . Years of education: None  . Highest education level: None  Social Needs  . Financial resource strain: None  . Food insecurity - worry: None  . Food insecurity - inability: None  . Transportation needs - medical: None  . Transportation needs - non-medical: None  Occupational History  . None  Tobacco Use  . Smoking status: Never Smoker  . Smokeless tobacco: Never Used  Substance and Sexual Activity  . Alcohol use: Yes    Alcohol/week: 1.2 oz    Types: 2 Glasses of wine per week    Comment: occasionally  . Drug use: Yes    Types: Marijuana    Comment: yesterday  . Sexual activity: Yes    Birth control/protection: None  Other Topics Concern  . None  Social History Narrative  . None    Allergies: No Known Allergies  Metabolic Disorder Labs: Lab Results  Component Value Date   HGBA1C 5.7 07/15/2014   No results found for: PROLACTIN Lab Results  Component Value Date   CHOL 98 (L) 04/11/2017   TRIG 88 04/11/2017   HDL 46 04/11/2017   CHOLHDL 2.1 04/11/2017   VLDL 12 04/11/2016   LDLCALC 34 04/11/2017   LDLCALC 37 04/11/2016   Lab Results  Component Value Date   TSH 1.380 05/25/2017   TSH 3.100  07/06/2014    Therapeutic Level Labs: No results found for: LITHIUM No results found for: VALPROATE No components found for:  CBMZ  Current Medications: Current Outpatient Medications  Medication Sig Dispense Refill  . cetirizine (ZYRTEC) 10 MG tablet Take 1 tablet (10 mg total) by mouth daily. 30 tablet 3  . cyclobenzaprine (FLEXERIL) 10 MG tablet Take 1 tablet (10 mg total) by mouth 2 (two) times daily as needed for muscle spasms. 60 tablet 1  . hydrOXYzine (ATARAX/VISTARIL) 10 MG tablet Take 1 tablet (10 mg total) by mouth 2 (two) times daily as needed for anxiety (panic). 30 tablet 2  . losartan-hydrochlorothiazide (HYZAAR) 100-25 MG tablet Take 1 tablet by mouth daily. 30 tablet 5  .  meloxicam (MOBIC) 7.5 MG tablet Take 1 tablet (7.5 mg total) by mouth daily. 30 tablet 1  . mirtazapine (REMERON) 30 MG tablet 30 mg for 2 weeks, then increase to 45 mg nightly 14 tablet 0  . norethindrone-ethinyl estradiol (LOESTRIN FE 1/20) 1-20 MG-MCG tablet Take 1 tablet by mouth daily. 1 Package 11  . mirtazapine (REMERON) 45 MG tablet Take 1 tablet (45 mg total) at bedtime by mouth. 90 tablet 1   No current facility-administered medications for this visit.      Musculoskeletal: Strength & Muscle Tone: within normal limits Gait & Station: normal Patient leans: N/A  Psychiatric Specialty Exam: ROS  Blood pressure 132/74, pulse 97, height 5\' 7"  (1.702 m), weight (!) 307 lb 9.6 oz (139.5 kg).Body mass index is 48.18 kg/m.  General Appearance: Casual and Fairly Groomed  Eye Contact:  Fair  Speech:  Clear and Coherent  Volume:  Normal  Mood:  Depressed and Dysphoric  Affect:  Congruent and Depressed  Thought Process:  Goal Directed and Descriptions of Associations: Intact  Orientation:  Full (Time, Place, and Person)  Thought Content: Logical   Suicidal Thoughts:  No  Homicidal Thoughts:  No  Memory:  Immediate;   Fair  Judgement:  Fair  Insight:  Fair  Psychomotor Activity:  Normal  Concentration:  Attention Span: Fair  Recall:  [employer] of Knowledge: Fair  Language: Good  Akathisia:  Negative  Handed:  Right  AIMS (if indicated): not done  Assets:  Communication Skills Desire for Improvement Housing Transportation Vocational/Educational  ADL's:  Intact  Cognition: WNL  Sleep:  Good   Screenings: GAD-7     Office Visit from 07/04/2017 in [place] Office Visit from 05/25/2017 in [place] for [place] Office Visit from 04/07/2017 in [place] [facility] Office Visit from 09/19/2016 in [place] for [place] Office Visit from 09/08/2016 in [facility] [place]  Total  GAD-7 Score  4  20  13   0  1    PHQ2-9     Office Visit from 07/04/2017 in [facility] Office Visit from 05/25/2017 in [place] for [place] Office Visit from 04/07/2017 in [place] Office Visit from 09/19/2016 in [place] for [place] Office Visit from 09/08/2016 in [facility]  PHQ-2 Total Score  2  4  6   0  0  PHQ-9 Total Score  8  16  18  3  6        Assessment and Plan:  Claudetta Sallie continues with depressive symptoms, in the setting of untreated sleep apnea.  Financial limitations have placed constraint on her acquiring a CPAP at this  time.  I continue to counsel her on avoidance of alcohol and CNS depressants.  Remeron has been helpful for her sleep and appetite, and she would benefit from up titration to further target depressive symptoms.  She does not have any suicidal thoughts at this time.  She is actively engaged in individual therapy.  We will follow-up in 3 months and proceed as below.  1. Moderate episode of recurrent major depressive disorder ([place])     Status of current problems: unchanged  Labs Ordered: No orders of the defined types were placed in this encounter.   Labs Reviewed: NA  Collateral Obtained/Records Reviewed: Reviewed the recent polysomnography split study, and reviewed these results with the patient, including recommendation of CPAP  Plan:  Continue to encourage CPAP Reach out to sleep clinic for additional support and information on helping the patient obtain a CPAP Increase Remeron to 30 mg x 2 weeks, then 45 mg thereafter Vistaril 10 mg 1-2 times a day as needed; okay to use 2 tablets Continue individual therapy, return to clinic for med check in 3 months  I spent 15 minutes with the patient in direct face-to-face clinical care.  Greater than 50% of this time was spent in counseling and coordination of care with the  patient.    Aundra Dubin, MD 07/24/2017, 11:36 AM

## 2017-07-25 ENCOUNTER — Encounter (HOSPITAL_COMMUNITY): Payer: Self-pay | Admitting: Licensed Clinical Social Worker

## 2017-07-25 ENCOUNTER — Ambulatory Visit (INDEPENDENT_AMBULATORY_CARE_PROVIDER_SITE_OTHER): Payer: No Typology Code available for payment source | Admitting: Licensed Clinical Social Worker

## 2017-07-25 DIAGNOSIS — F1010 Alcohol abuse, uncomplicated: Secondary | ICD-10-CM

## 2017-07-25 DIAGNOSIS — F411 Generalized anxiety disorder: Secondary | ICD-10-CM

## 2017-07-25 DIAGNOSIS — F331 Major depressive disorder, recurrent, moderate: Secondary | ICD-10-CM

## 2017-07-25 NOTE — Progress Notes (Signed)
   THERAPIST PROGRESS NOTE  Session Time: 3:30pm-4:30pm  Participation Level: Active  Behavioral Response: Well GroomedAlertAnxious and Depressed  Type of Therapy: Individual Therapy   Treatment Goals addressed: Diagnosis: major depressive disorder, recurrent episode, moderate and Alcohol Use Disorder, Mild   Interventions: CBT and Motivational Interviewing   Summary: Cathy Bryan is a 31 y.o. female who presents with Major Depressive Disorder, recurrent episode, moderate, Generalized Anxiety Disorder, and Alcohol Use Disorder, Mild.   Suicidal/Homicidal: Nowithout intent/plan   Therapist Response: Cathy Bryan engaged well in session and discussed ongoing drama with her now ex-girlfriend. Cathy Bryan processed her thoughts and feelings about the break up, as well as her recent interactions with ex-girlfriend. Clinician identified lack of angst about the breakup, which may explain why Cathy Bryan continues to feel uncertain about the breakup. 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 She identified her ability to have more trust with this soror, but still some hesitation about continuing on with the sorority. Cathy Bryan then processed her thoughts and feelings about parents. Clinician identified the importance of getting her feelings out about their response when Cathy Bryan came out. Cathy Bryan processed her feelings and noted the urge to discuss this with her family over the holidays. Clinician encouraged writing down her thoughts and rehearsing with brother prior to bringing up with parents.   Plan: Return again in 1-2 weeks.   Diagnosis:      Axis I: 296.32 (F33.1) Major Depressive Disorder, Recurrent episode, Moderate 300.02 (F41.1) Generalized Anxiety Disorder 305.00 (F10.10) Alcohol Use Disorder, mild   [name], LCSW 07/25/2017

## 2017-07-26 MED FILL — LARIN FE 1-20 TABLET: 1-20 | 28 days supply | Qty: 28 | Fill #1

## 2017-07-26 MED FILL — LOSARTAN-HCTZ 100-25 MG TAB: 100-25 | 30 days supply | Qty: 30 | Fill #3

## 2017-08-09 MED FILL — MELOXICAM 7.5 MG TABLET: 7.5 | 30 days supply | Qty: 30 | Fill #0

## 2017-08-09 MED FILL — MIRTAZAPINE 45 MG TABS: 45 | 30 days supply | Qty: 30 | Fill #0

## 2017-08-09 MED FILL — ?CYCLOBENZAPRINE 10 MG TABL: 10 | 30 days supply | Qty: 60 | Fill #0

## 2017-08-15 ENCOUNTER — Encounter (HOSPITAL_COMMUNITY): Payer: Self-pay | Admitting: Psychiatry

## 2017-08-15 ENCOUNTER — Ambulatory Visit (INDEPENDENT_AMBULATORY_CARE_PROVIDER_SITE_OTHER): Payer: No Typology Code available for payment source | Admitting: Licensed Clinical Social Worker

## 2017-08-15 ENCOUNTER — Encounter (HOSPITAL_COMMUNITY): Payer: Self-pay | Admitting: Licensed Clinical Social Worker

## 2017-08-15 DIAGNOSIS — F330 Major depressive disorder, recurrent, mild: Secondary | ICD-10-CM

## 2017-08-15 DIAGNOSIS — F411 Generalized anxiety disorder: Secondary | ICD-10-CM

## 2017-08-15 DIAGNOSIS — F1010 Alcohol abuse, uncomplicated: Secondary | ICD-10-CM

## 2017-08-15 NOTE — Progress Notes (Signed)
   THERAPIST PROGRESS NOTE  Session Time: 11:00am-12:00pm  Participation Level: Active  Behavioral Response: Well GroomedAlertEuthymic  Type of Therapy: Individual Therapy   Treatment Goals addressed: Diagnosis: major depressive disorder, recurrent episode, moderate and Alcohol Use Disorder, Mild   Interventions: CBT and Motivational Interviewing   Summary: Wyoma Genson is a 31 y.o. female who presents with Major Depressive Disorder, recurrent episode, moderate, Generalized Anxiety Disorder, and Alcohol Use Disorder, Mild.   Suicidal/Homicidal: Nowithout intent/plan   Therapist Response: Shuntae engaged well in session and discussed current interactions with friends, family members, and ex-partners. Satin reports she has become more confident in her ability to speak up for herself. 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 [place] consulted with clinician about career planning and what she will do once she graduates in May. Delayza projected some anxiety and sadness about leaving school, as well as some concern that jobs available now may not be available when she graduates.  Clinician encouraged Morgyn to focus on herself and to engage in more self-care activities, surround herself with positive people and support, rather than with people who are not trustworthy or very judgemental. Clinician also utilized MI to reflect and summarize thoughts and feelings about interactions with mother over Thanksgiving.    Plan: Return again in 2-3 weeks.   Diagnosis:      Axis I: 296.32 (F33.1) Major Depressive Disorder, Recurrent episode, Moderate 300.02 (F41.1) Generalized Anxiety Disorder 305.00 (F10.10) Alcohol Use Disorder, mild   [name], LCSW 08/15/2017

## 2017-08-16 ENCOUNTER — Ambulatory Visit: Payer: Self-pay | Admitting: Family Medicine

## 2017-08-16 ENCOUNTER — Ambulatory Visit (HOSPITAL_COMMUNITY): Payer: Self-pay | Admitting: Licensed Clinical Social Worker

## 2017-08-21 ENCOUNTER — Ambulatory Visit (HOSPITAL_COMMUNITY): Payer: Self-pay | Admitting: Licensed Clinical Social Worker

## 2017-08-28 ENCOUNTER — Encounter: Payer: Self-pay | Admitting: Family Medicine

## 2017-08-28 ENCOUNTER — Encounter (HOSPITAL_COMMUNITY): Payer: Self-pay | Admitting: Licensed Clinical Social Worker

## 2017-08-28 ENCOUNTER — Ambulatory Visit (INDEPENDENT_AMBULATORY_CARE_PROVIDER_SITE_OTHER): Payer: No Typology Code available for payment source | Admitting: Licensed Clinical Social Worker

## 2017-08-28 ENCOUNTER — Ambulatory Visit: Payer: Self-pay | Attending: Family Medicine | Admitting: Family Medicine

## 2017-08-28 DIAGNOSIS — F419 Anxiety disorder, unspecified: Secondary | ICD-10-CM | POA: Insufficient documentation

## 2017-08-28 DIAGNOSIS — F330 Major depressive disorder, recurrent, mild: Secondary | ICD-10-CM

## 2017-08-28 DIAGNOSIS — F329 Major depressive disorder, single episode, unspecified: Secondary | ICD-10-CM | POA: Insufficient documentation

## 2017-08-28 DIAGNOSIS — M5431 Sciatica, right side: Secondary | ICD-10-CM

## 2017-08-28 DIAGNOSIS — I10 Essential (primary) hypertension: Secondary | ICD-10-CM

## 2017-08-28 DIAGNOSIS — F1099 Alcohol use, unspecified with unspecified alcohol-induced disorder: Secondary | ICD-10-CM

## 2017-08-28 DIAGNOSIS — E669 Obesity, unspecified: Secondary | ICD-10-CM | POA: Insufficient documentation

## 2017-08-28 DIAGNOSIS — Z79899 Other long term (current) drug therapy: Secondary | ICD-10-CM | POA: Insufficient documentation

## 2017-08-28 DIAGNOSIS — F411 Generalized anxiety disorder: Secondary | ICD-10-CM

## 2017-08-28 DIAGNOSIS — F331 Major depressive disorder, recurrent, moderate: Secondary | ICD-10-CM

## 2017-08-28 MED ORDER — CYCLOBENZAPRINE HCL 10 MG PO TABS: 10.0000 mg | ORAL_TABLET | Freq: Two times a day (BID) | ORAL | 1 refills | Status: AC | PRN

## 2017-08-28 MED ORDER — MELOXICAM 7.5 MG PO TABS: 7.5000 mg | ORAL_TABLET | Freq: Every day | ORAL | 1 refills | Status: AC

## 2017-08-28 MED ORDER — LOSARTAN POTASSIUM-HCTZ 100-25 MG PO TABS: 1.0000 | ORAL_TABLET | Freq: Every day | ORAL | 5 refills | Status: DC

## 2017-08-28 MED FILL — LARIN FE 1-20 TABLET: 1-20 | 28 days supply | Qty: 28 | Fill #2

## 2017-08-28 MED FILL — LOSARTAN-HCTZ 100-25 MG TAB: 100-25 | 30 days supply | Qty: 30 | Fill #0

## 2017-08-28 NOTE — Patient Instructions (Signed)
Sciatica Sciatica is pain, numbness, weakness, or tingling along your sciatic nerve. The sciatic nerve starts in the lower back and goes down the back of each leg. Sciatica happens when this nerve is pinched or has pressure put on it. Sciatica usually goes away on its own or with treatment. Sometimes, sciatica may keep coming back (recur). Follow these instructions at home: Medicines  Take over-the-counter and prescription medicines only as told by your doctor.  Do not drive or use heavy machinery while taking prescription pain medicine. Managing pain  If directed, put ice on the affected area. ? Put ice in a plastic bag. ? Place a towel between your skin and the bag. ? Leave the ice on for 20 minutes, 2-3 times a day.  After icing, apply heat to the affected area before you exercise or as often as told by your doctor. Use the heat source that your doctor tells you to use, such as a moist heat pack or a heating pad. ? Place a towel between your skin and the heat source. ? Leave the heat on for 20-30 minutes. ? Remove the heat if your skin turns bright red. This is especially important if you are unable to feel pain, heat, or cold. You may have a greater risk of getting burned. Activity  Return to your normal activities as told by your doctor. Ask your doctor what activities are safe for you. ? Avoid activities that make your sciatica worse.  Take short rests during the day. Rest in a lying or standing position. This is usually better than sitting to rest. ? When you rest for a long time, do some physical activity or stretching between periods of rest. ? Avoid sitting for a long time without moving. Get up and move around at least one time each hour.  Exercise and stretch regularly, as told by your doctor.  Do not lift anything that is heavier than 10 lb (4.5 kg) while you have symptoms of sciatica. ? Avoid lifting heavy things even when you do not have symptoms. ? Avoid lifting heavy  things over and over.  When you lift objects, always lift in a way that is safe for your body. To do this, you should: ? Bend your knees. ? Keep the object close to your body. ? Avoid twisting. General instructions  Use good posture. ? Avoid leaning forward when you are sitting. ? Avoid hunching over when you are standing.  Stay at a healthy weight.  Wear comfortable shoes that support your feet. Avoid wearing high heels.  Avoid sleeping on a mattress that is too soft or too hard. You might have less pain if you sleep on a mattress that is firm enough to support your back.  Keep all follow-up visits as told by your doctor. This is important. Contact a doctor if:  You have pain that: ? Wakes you up when you are sleeping. ? Gets worse when you lie down. ? Is worse than the pain you have had in the past. ? Lasts longer than 4 weeks.  You lose weight for without trying. Get help right away if:  You cannot control when you pee (urinate) or poop (have a bowel movement).  You have weakness in any of these areas and it gets worse. ? Lower back. ? Lower belly (pelvis). ? Butt (buttocks). ? Legs.  You have redness or swelling of your back.  You have a burning feeling when you pee. This information is not intended to replace   advice given to you by your health care provider. Make sure you discuss any questions you have with your health care provider. Document Released: 06/07/2008 Document Revised: 02/04/2016 Document Reviewed: 05/08/2015 Elsevier Interactive Patient Education  2018 Elsevier Inc.  

## 2017-08-28 NOTE — Progress Notes (Signed)
   THERAPIST PROGRESS NOTE  Session Time: 3:35-4:30pm  Participation Level: Active  Behavioral Response: Well GroomedAlertEuthymic  Type of Therapy: Individual Therapy   Treatment Goals addressed: Diagnosis: major depressive disorder, recurrent episode, moderate and Alcohol Use Disorder, Mild   Interventions: CBT and Motivational Interviewing   Summary: Dahlia Nifong is a 31 y.o. female who presents with Major Depressive Disorder, recurrent episode, moderate, Generalized Anxiety Disorder, and Alcohol Use Disorder, Mild.   Suicidal/Homicidal: Nowithout intent/plan   Therapist Response: Therasa engaged well in session and discussed updates in her personal life, including new job and new "friend". Elasia processed thoughts and feelings about this new job and plans to quit working at [employer]. Brunette identified excitement to work in her field of study and to get some more experience for her resume. She also reports possibility of moving into a full-time position. Adryanna processed recent interactions with ex-partner and noted change in her perception of the interaction. She reports she feels more in control of herself and her feelings, as well as her ability to make decisions about what she will and won't do.   Plan: Return again in 1-2 weeks.   Diagnosis:      Axis I: 296.32 (F33.1) Major Depressive Disorder, Recurrent episode, Moderate 300.02 (F41.1) Generalized Anxiety Disorder 305.00 (F10.10) Alcohol Use Disorder, mild   [name], LCSW 08/28/2017

## 2017-08-28 NOTE — Progress Notes (Signed)
Subjective:  Patient ID: Cathy Bryan, female    DOB: 01-04-1986  Age: 31 y.o. MRN: [id number]  CC: Sciatica   HPI Cathy Bryan  is a 31 year old female with a history of Obesity, hypertension, anxiety and depression (managed by behavioral health) resents today for follow-up of sciatica.  At her last office visit she had presented with right-sided sciatica for which she was placed on meloxicam and Flexeril and endorses some relief.  Pain is rated at a 5/10 at this time. Onset of pain was 2 months ago and is associated with radiation down the right lower extremity; she endorses standing a lot at her job. Pain is intermittent and she would sometimes have 2 pain-free days interspaced by consecutive days with pain.  Past Medical History:  Diagnosis Date  . Anxiety   . Depression   . Endometriosis   . Hypertension     Past Surgical History:  Procedure Laterality Date  . APPENDECTOMY  04/19/2016   Procedure: APPENDECTOMY;  Surgeon: Lavonia Drafts, MD;  Location: [facility];  Service: Gynecology;;  . LAPAROTOMY Right 04/19/2016   Procedure: EXPLORATORY LAPAROTOMY WITH BILATERAL OOPHORECTOMY  LYSIS OF ADHESIONS;  Surgeon: Lavonia Drafts, MD;  Location: [facility];  Service: Gynecology;  Laterality: Right;    No Known Allergies   Outpatient Medications Prior to Visit  Medication Sig Dispense Refill  . hydrOXYzine (ATARAX/VISTARIL) 10 MG tablet Take 1 tablet (10 mg total) by mouth 2 (two) times daily as needed for anxiety (panic). 30 tablet 2  . mirtazapine (REMERON) 45 MG tablet Take 1 tablet (45 mg total) at bedtime by mouth. 90 tablet 1  . norethindrone-ethinyl estradiol (LOESTRIN FE 1/20) 1-20 MG-MCG tablet Take 1 tablet by mouth daily. 1 Package 11  . cyclobenzaprine (FLEXERIL) 10 MG tablet Take 1 tablet (10 mg total) by mouth 2 (two) times daily as needed for muscle spasms. 60 tablet 1  . losartan-hydrochlorothiazide (HYZAAR) 100-25 MG tablet Take 1 tablet by mouth daily. 30  tablet 5  . meloxicam (MOBIC) 7.5 MG tablet Take 1 tablet (7.5 mg total) by mouth daily. 30 tablet 1  . cetirizine (ZYRTEC) 10 MG tablet Take 1 tablet (10 mg total) by mouth daily. (Patient not taking: Reported on 08/28/2017) 30 tablet 3  . mirtazapine (REMERON) 30 MG tablet 30 mg for 2 weeks, then increase to 45 mg nightly (Patient not taking: Reported on 08/28/2017) 14 tablet 0   No facility-administered medications prior to visit.     ROS Review of Systems  Constitutional: Negative for activity change, appetite change and fatigue.  HENT: Negative for congestion, sinus pressure and sore throat.   Eyes: Negative for visual disturbance.  Respiratory: Negative for cough, chest tightness, shortness of breath and wheezing.   Cardiovascular: Negative for chest pain and palpitations.  Gastrointestinal: Negative for abdominal distention, abdominal pain and constipation.  Endocrine: Negative for polydipsia.  Genitourinary: Negative for dysuria and frequency.  Musculoskeletal: Positive for back pain. Negative for arthralgias.  Skin: Negative for rash.  Neurological: Negative for tremors, light-headedness and numbness.  Hematological: Does not bruise/bleed easily.  Psychiatric/Behavioral: Negative for agitation and behavioral problems.    Objective:  BP (!) 141/90   Pulse 82   Temp 97.7 F (36.5 C) (Oral)   Ht 5\' 7"  (1.702 m)   Wt (!) 307 lb 9.6 oz (139.5 kg)   LMP 08/17/2017   SpO2 99%   BMI 48.18 kg/m   BP/Weight 08/28/2017 07/04/2017 96/75/9163  Systolic BP 846 659 -  Diastolic BP  90 79 -  Wt. (Lbs) 307.6 301.2 304  BMI 48.18 47.17 47.61  Some encounter information is confidential and restricted. Go to Review Flowsheets activity to see all data.      Physical Exam  Constitutional: She is oriented to person, place, and time. She appears well-developed and well-nourished.  Cardiovascular: Normal rate, normal heart sounds and intact distal pulses.  No murmur  heard. Pulmonary/Chest: Effort normal and breath sounds normal. She has no wheezes. She has no rales. She exhibits no tenderness.  Abdominal: Soft. Bowel sounds are normal. She exhibits no distension and no mass. There is no tenderness.  Musculoskeletal: Normal range of motion. She exhibits tenderness (TTP of right lumbar region; positive straight leg raise on the R).  Neurological: She is alert and oriented to person, place, and time.  Psychiatric: She has a normal mood and affect.     Assessment & Plan:   1. Essential hypertension Likely above goal of 130/80 Lifestyle modifications including weight loss - she has gained 7 pounds in the last 6 months which could be attributed to Remeron Might need to discuss this with psychiatrist. - losartan-hydrochlorothiazide (HYZAAR) 100-25 MG tablet; Take 1 tablet by mouth daily.  Dispense: 30 tablet; Refill: 5  2. Right sided sciatica Not fully controlled Apply heat - cyclobenzaprine (FLEXERIL) 10 MG tablet; Take 1 tablet (10 mg total) by mouth 2 (two) times daily as needed for muscle spasms.  Dispense: 60 tablet; Refill: 1 - meloxicam (MOBIC) 7.5 MG tablet; Take 1 tablet (7.5 mg total) by mouth daily.  Dispense: 30 tablet; Refill: 1 - Ambulatory referral to Physical Therapy   Meds ordered this encounter  Medications  . cyclobenzaprine (FLEXERIL) 10 MG tablet    Sig: Take 1 tablet (10 mg total) by mouth 2 (two) times daily as needed for muscle spasms.    Dispense:  60 tablet    Refill:  1  . meloxicam (MOBIC) 7.5 MG tablet    Sig: Take 1 tablet (7.5 mg total) by mouth daily.    Dispense:  30 tablet    Refill:  1  . losartan-hydrochlorothiazide (HYZAAR) 100-25 MG tablet    Sig: Take 1 tablet by mouth daily.    Dispense:  30 tablet    Refill:  5    Follow-up: Return in about 3 months (around 11/26/2017) for follow Up of hypertension and sciatica.   Arnoldo Morale MD

## 2017-09-14 ENCOUNTER — Ambulatory Visit (HOSPITAL_COMMUNITY): Payer: Self-pay | Admitting: Licensed Clinical Social Worker

## 2017-10-18 ENCOUNTER — Ambulatory Visit (HOSPITAL_COMMUNITY): Payer: Self-pay | Admitting: Psychiatry

## 2017-10-26 MED FILL — ?CYCLOBENZAPRINE 10MG TB: 10 | 30 days supply | Qty: 60 | Fill #0

## 2017-10-26 MED FILL — MELOXICAM 7.5 MG TABLET: 7.5 | 30 days supply | Qty: 30 | Fill #0

## 2017-10-26 MED FILL — LOSARTAN-HCTZ 100-25 MG TAB: 100-25 | 30 days supply | Qty: 30 | Fill #1

## 2017-10-26 MED FILL — LARIN FE 1-20 TABLET: 1-20 | 28 days supply | Qty: 28 | Fill #3

## 2017-11-04 ENCOUNTER — Encounter: Payer: Self-pay | Admitting: Emergency Medicine

## 2017-11-04 ENCOUNTER — Emergency Department
Admission: EM | Admit: 2017-11-04 | Discharge: 2017-11-04 | Disposition: A | Discharge status: Home / Self Care | Payer: Self-pay | Source: Home / Self Care

## 2017-11-04 DIAGNOSIS — N390 Urinary tract infection, site not specified: Secondary | ICD-10-CM

## 2017-11-04 LAB — POCT URINALYSIS DIP (MANUAL ENTRY)
Bilirubin, UA: NEGATIVE
Glucose, UA: NEGATIVE mg/dL
NITRITE UA: NEGATIVE
PROTEIN UA: NEGATIVE mg/dL
RBC UA: NEGATIVE
SPEC GRAV UA: 1.02 (ref 1.010–1.025)
UROBILINOGEN UA: 0.2 U/dL
pH, UA: 6 (ref 5.0–8.0)

## 2017-11-04 MED ORDER — CEPHALEXIN 500 MG PO CAPS: 500.0000 mg | ORAL_CAPSULE | Freq: Four times a day (QID) | ORAL | 0 refills | Status: DC

## 2017-11-04 NOTE — ED Triage Notes (Signed)
Patient complaining of possible UTI x 3 days, dysuria, no urgency, no frequency

## 2017-11-04 NOTE — Discharge Instructions (Signed)
Return if any problems.

## 2017-11-06 NOTE — ED Provider Notes (Signed)
[facility]    CSN: [id number] Arrival date & time: 11/04/17  1024     History   Chief Complaint Chief Complaint  Patient presents with  . Urinary Tract Infection    HPI [name] is a 32 y.o. female.   The history is provided by the patient. No language interpreter was used.  Urinary Tract Infection  Pain quality:  Aching Pain severity:  No pain Onset quality:  Gradual Duration:  3 days Timing:  Constant Progression:  Worsening Chronicity:  New Recent urinary tract infections: no   Relieved by:  Nothing Ineffective treatments:  None tried Associated symptoms: no abdominal pain   Risk factors: no renal disease     Past Medical History:  Diagnosis Date  . Anxiety   . Depression   . Endometriosis   . Hypertension     Patient Active Problem List   Diagnosis Date Noted  . Right sided sciatica 08/28/2017  . Anxiety and depression 04/07/2017  . 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 Post-operative state 04/19/2016  . Morbid obesity ([place]) 04/11/2016  . Hypertension 12/15/2015  . Endometriosis 12/15/2015  . TOA (tubo-ovarian abscess) 01/03/2014  . Abnormal cytological findings in female genital organs 10/03/2013    Past Surgical History:  Procedure Laterality Date  . APPENDECTOMY  04/19/2016   Procedure: APPENDECTOMY;  Surgeon: Lavonia Drafts, MD;  Location: [facility];  Service: Gynecology;;  . LAPAROTOMY Right 04/19/2016   Procedure: EXPLORATORY LAPAROTOMY WITH BILATERAL OOPHORECTOMY  LYSIS OF ADHESIONS;  Surgeon: Lavonia Drafts, MD;  Location: [facility];  Service: Gynecology;  Laterality: Right;    OB History    Gravida Para Term Preterm AB Living   0 0 0 0 0 0   SAB TAB Ectopic Multiple Live Births   0 0 0 0         Home Medications    Prior to Admission medications   Medication Sig Start Date End Date Taking? Authorizing Provider  cephALEXin (KEFLEX) 500 MG capsule Take 1  capsule (500 mg total) by mouth 4 (four) times daily. 11/04/17   Fransico Meadow, PA-C  cyclobenzaprine (FLEXERIL) 10 MG tablet Take 1 tablet (10 mg total) by mouth 2 (two) times daily as needed for muscle spasms. 08/28/17   Charlott Rakes, MD  losartan-hydrochlorothiazide (HYZAAR) 100-25 MG tablet Take 1 tablet by mouth daily. 08/28/17   Charlott Rakes, MD  meloxicam (MOBIC) 7.5 MG tablet Take 1 tablet (7.5 mg total) by mouth daily. 08/28/17   Charlott Rakes, MD  mirtazapine (REMERON) 45 MG tablet Take 1 tablet (45 mg total) at bedtime by mouth. 07/24/17   Eksir, Richard Miu, MD  norethindrone-ethinyl estradiol (LOESTRIN FE 1/20) 1-20 MG-MCG tablet Take 1 tablet by mouth daily. 05/25/17   Lavonia Drafts, MD    Family History Family History  Problem Relation Age of Onset  . Hypertension Mother   . Hypertension Father   . Diabetes Father     Social History Social History   Tobacco Use  . Smoking status: Never Smoker  . Smokeless tobacco: Never Used  Substance Use Topics  . Alcohol use: Yes    Alcohol/week: 1.2 oz    Types: 2 Glasses of wine per week    Comment: occasionally  . Drug use: Yes    Types: Marijuana    Comment: yesterday     Allergies   Patient has no known allergies.   Review of Systems Review of Systems  Gastrointestinal:  Negative for abdominal pain.  All other systems reviewed and are negative.    Physical Exam Triage Vital Signs ED Triage Vitals  Enc Vitals Group     BP 11/04/17 1115 (!) 148/85     Pulse Rate 11/04/17 1115 90     Resp --      Temp 11/04/17 1115 98.4 F (36.9 C)     Temp Source 11/04/17 1115 Oral     SpO2 11/04/17 1115 98 %     Weight 11/04/17 1116 (!) 308 lb 4 oz (139.8 kg)     Height 11/04/17 1116 5\' 7"  (1.702 m)     Head Circumference --      Peak Flow --      Pain Score 11/04/17 1116 5     Pain Loc --      Pain Edu? --      Excl. in [place]? --    No data found.  Updated Vital Signs BP (!) 148/85 (BP  Location: Right Arm)   Pulse 90   Temp 98.4 F (36.9 C) (Oral)   Ht 5\' 7"  (1.702 m)   Wt (!) 308 lb 4 oz (139.8 kg)   SpO2 98%   BMI 48.28 kg/m   Visual Acuity Right Eye Distance:   Left Eye Distance:   Bilateral Distance:    Right Eye Near:   Left Eye Near:    Bilateral Near:     Physical Exam  Constitutional: She is oriented to person, place, and time. She appears well-developed and well-nourished.  HENT:  Head: Normocephalic.  Eyes: EOM are normal.  Neck: Normal range of motion.  Pulmonary/Chest: Effort normal.  Abdominal: Soft. She exhibits no distension.  Musculoskeletal: Normal range of motion.  Neurological: She is alert and oriented to person, place, and time.  Psychiatric: She has a normal mood and affect.  Nursing note and vitals reviewed.    UC Treatments / Results  Labs (all labs ordered are listed, but only abnormal results are displayed) Labs Reviewed  POCT URINALYSIS DIP (MANUAL ENTRY) - Abnormal; Notable for the following components:      Result Value   Ketones, POC UA trace (5) (*)    Leukocytes, UA Trace (*)    All other components within normal limits    EKG  EKG Interpretation None       Radiology No results found.  Procedures Procedures (including critical care time)  Medications Ordered in UC Medications - No data to display   Initial Impression / Assessment and Plan / UC Course  I have reviewed the triage vital signs and the nursing notes.  Pertinent labs & imaging results that were available during my care of the patient were reviewed by me and considered in my medical decision making (see chart for details).    MDM  Urine results reviewed.  I will treat pt with keflex.   Final Clinical Impressions(s) / UC Diagnoses   Final diagnoses:  Urinary tract infection without hematuria, site unspecified    ED Discharge Orders        Ordered    cephALEXin (KEFLEX) 500 MG capsule  4 times daily     11/04/17 1142        Controlled Substance Prescriptions [place] Controlled Substance Registry consulted? Not Applicable   An After Visit Summary was printed and given to the patient.   Fransico Meadow, [place] 11/06/17 1454

## 2017-11-27 ENCOUNTER — Ambulatory Visit: Payer: Self-pay | Admitting: Family Medicine

## 2017-12-13 ENCOUNTER — Ambulatory Visit: Payer: Self-pay | Attending: Family Medicine | Admitting: Family Medicine

## 2017-12-13 ENCOUNTER — Other Ambulatory Visit: Payer: Self-pay

## 2017-12-13 ENCOUNTER — Encounter: Payer: Self-pay | Admitting: Family Medicine

## 2017-12-13 VITALS — BP 152/103 | HR 70 | Temp 98.0°F | Resp 16 | Wt 315.4 lb

## 2017-12-13 DIAGNOSIS — Z888 Allergy status to other drugs, medicaments and biological substances status: Secondary | ICD-10-CM | POA: Insufficient documentation

## 2017-12-13 DIAGNOSIS — Z6841 Body Mass Index (BMI) 40.0 and over, adult: Secondary | ICD-10-CM | POA: Insufficient documentation

## 2017-12-13 DIAGNOSIS — G4489 Other headache syndrome: Secondary | ICD-10-CM | POA: Insufficient documentation

## 2017-12-13 DIAGNOSIS — F419 Anxiety disorder, unspecified: Secondary | ICD-10-CM | POA: Insufficient documentation

## 2017-12-13 DIAGNOSIS — I10 Essential (primary) hypertension: Secondary | ICD-10-CM | POA: Insufficient documentation

## 2017-12-13 DIAGNOSIS — R42 Dizziness and giddiness: Secondary | ICD-10-CM | POA: Insufficient documentation

## 2017-12-13 DIAGNOSIS — N809 Endometriosis, unspecified: Secondary | ICD-10-CM | POA: Insufficient documentation

## 2017-12-13 DIAGNOSIS — Z79899 Other long term (current) drug therapy: Secondary | ICD-10-CM | POA: Insufficient documentation

## 2017-12-13 DIAGNOSIS — Z131 Encounter for screening for diabetes mellitus: Secondary | ICD-10-CM

## 2017-12-13 DIAGNOSIS — E669 Obesity, unspecified: Secondary | ICD-10-CM | POA: Insufficient documentation

## 2017-12-13 DIAGNOSIS — F329 Major depressive disorder, single episode, unspecified: Secondary | ICD-10-CM | POA: Insufficient documentation

## 2017-12-13 LAB — POCT GLYCOSYLATED HEMOGLOBIN (HGB A1C): Hemoglobin A1C: 5.8

## 2017-12-13 MED ORDER — OLMESARTAN MEDOXOMIL-HCTZ 40-25 MG PO TABS: 1.0000 | ORAL_TABLET | Freq: Every day | ORAL | 3 refills | Status: DC

## 2017-12-13 MED ORDER — FLUTICASONE PROPIONATE 50 MCG/ACT NA SUSP: 2.0000 | Freq: Every day | NASAL | 1 refills | Status: DC

## 2017-12-13 MED ORDER — CETIRIZINE HCL 10 MG PO TABS: 10.0000 mg | ORAL_TABLET | Freq: Every day | ORAL | 1 refills | Status: DC

## 2017-12-13 MED ORDER — LISINOPRIL-HYDROCHLOROTHIAZIDE 20-12.5 MG PO TABS: 2.0000 | ORAL_TABLET | Freq: Every day | ORAL | 3 refills | Status: DC

## 2017-12-13 MED ORDER — TELMISARTAN-HCTZ 80-25 MG PO TABS: 1.0000 | ORAL_TABLET | Freq: Every day | ORAL | 3 refills | Status: DC

## 2017-12-13 MED FILL — FLUTICASONE PROP 50 MCG SPR: 50 | 30 days supply | Qty: 16 | Fill #0 | Status: TO

## 2017-12-13 MED FILL — LARIN FE 1-20 TABLET: 1-20 | 28 days supply | Qty: 28 | Fill #4

## 2017-12-13 MED FILL — TELMISARTAN-HCTZ 80-25 MG T: 80-25 | 30 days supply | Qty: 30 | Fill #0

## 2017-12-13 NOTE — Progress Notes (Signed)
Subjective:  Patient ID: Cathy Bryan, female    DOB: [birth year]  Age: 32 y.o. MRN: [id number]  CC: Headache and Hypertension   HPI Sebrina Bryan is a 32 year old female with a history of hypertension, obesity, depression who presents today with headaches, fatigue which she describes as feeling extremely tired causing her to leave work to go and rest, shortness of breath.  She also has noticed associated lightheadedness which feel like she is about to pass out and is unrelated to change of position and symptoms have been present for the last 2 months.  She describes seeing spots.  Headaches are generalized, occur daily and are rated at a 6/10 at this time but denies nausea, vomiting. Denies photophobia, tearing or itching of her eyes, postnasal drip, sinus pressure, rhinorrhea. Of note she has gained 15 pounds in the last 6 months.  Her blood pressure is elevated which she attributes to running out of her medications but she is concerned about the recall associated with losartan/hydrochlorothiazide which she was informed of by her family. With regards to her depression she currently does not take Remeron as she states her sleep is good and she has coping mechanisms to help her with her depression. She currently takes oral contraceptive pills for management of endometriosis.  Past Medical History:  Diagnosis Date  . Anxiety   . Depression   . Endometriosis   . Hypertension     Past Surgical History:  Procedure Laterality Date  . APPENDECTOMY  04/19/2016   Procedure: APPENDECTOMY;  Surgeon: Lavonia Drafts, MD;  Location: [facility];  Service: Gynecology;;  . LAPAROTOMY Right 04/19/2016   Procedure: EXPLORATORY LAPAROTOMY WITH BILATERAL OOPHORECTOMY  LYSIS OF ADHESIONS;  Surgeon: Lavonia Drafts, MD;  Location: [facility];  Service: Gynecology;  Laterality: Right;    Family History  Problem Relation Age of Onset  . Hypertension Mother   . Hypertension Father   . Diabetes Father      Allergies  Allergen Reactions  . Lisinopril Cough     Outpatient Medications Prior to Visit  Medication Sig Dispense Refill  . cephALEXin (KEFLEX) 500 MG capsule Take 1 capsule (500 mg total) by mouth 4 (four) times daily. (Patient not taking: Reported on 12/13/2017) 40 capsule 0  . cyclobenzaprine (FLEXERIL) 10 MG tablet Take 1 tablet (10 mg total) by mouth 2 (two) times daily as needed for muscle spasms. 60 tablet 1  . meloxicam (MOBIC) 7.5 MG tablet Take 1 tablet (7.5 mg total) by mouth daily. 30 tablet 1  . mirtazapine (REMERON) 45 MG tablet Take 1 tablet (45 mg total) at bedtime by mouth. (Patient not taking: Reported on 12/13/2017) 90 tablet 1  . norethindrone-ethinyl estradiol (LOESTRIN FE 1/20) 1-20 MG-MCG tablet Take 1 tablet by mouth daily. 1 Package 11  . losartan-hydrochlorothiazide (HYZAAR) 100-25 MG tablet Take 1 tablet by mouth daily. 30 tablet 5   No facility-administered medications prior to visit.     ROS Review of Systems  Constitutional: Positive for fatigue. Negative for activity change and appetite change.  HENT: Negative for congestion, sinus pressure and sore throat.   Eyes: Negative for photophobia and visual disturbance.  Respiratory: Positive for shortness of breath. Negative for cough, chest tightness and wheezing.   Cardiovascular: Negative for chest pain and palpitations.  Gastrointestinal: Negative for abdominal distention, abdominal pain and constipation.  Endocrine: Negative for polydipsia.  Genitourinary: Negative for dysuria and frequency.  Musculoskeletal: Negative for arthralgias and back pain.  Skin: Negative for rash.  Neurological: Positive  for light-headedness and headaches. Negative for tremors and numbness.  Hematological: Does not bruise/bleed easily.  Psychiatric/Behavioral: Negative for agitation and behavioral problems.    Objective:  BP (!) 152/103   Pulse 70   Temp 98 F (36.7 C) (Oral)   Resp 16   Wt (!) 315 lb 6.4 oz (143.1  kg)   SpO2 100%   BMI 49.40 kg/m   BP/Weight 12/13/2017 11/04/2017 07/06/8526  Systolic BP 782 423 536  Diastolic BP 144 85 90  Wt. (Lbs) 315.4 308.25 307.6  BMI 49.4 48.28 48.18  Some encounter information is confidential and restricted. Go to Review Flowsheets activity to see all data.      Physical Exam  Constitutional: She is oriented to person, place, and time. She appears well-developed and well-nourished.  Cardiovascular: Normal rate, normal heart sounds and intact distal pulses.  No murmur heard. Pulmonary/Chest: Effort normal and breath sounds normal. She has no wheezes. She has no rales. She exhibits no tenderness.  Abdominal: Soft. Bowel sounds are normal. She exhibits no distension and no mass. There is no tenderness.  Musculoskeletal: Normal range of motion.  Neurological: She is alert and oriented to person, place, and time.  Skin: Skin is warm and dry.  Psychiatric: She has a normal mood and affect.     Lab Results  Component Value Date   HGBA1C 5.8 12/13/2017    Assessment & Plan:   1. Essential hypertension Control blood pressure due to running out of medications Discontinue losartan/HCTZ Initially placed on Benicar/HCTZ but was informed this was on back order by the pharmacy and this was subsequently changed to Micardis/HCTZ. Unable to tolerate lisinopril/HCTZ due to cough Counseled on blood pressure goal of less than 130/80, low-sodium, DASH diet, medication compliance, 150 minutes of moderate intensity exercise per week. Discussed medication compliance, adverse effects. - CMP14+EGFR  2. Lightheadedness EKG reveals nonspecific ST changes Possibly sinus related We will commence on Flonase and Zyrtec - VITAMIN D 25 Hydroxy (Vit-D Deficiency, Fractures) - Vitamin B12 - CBC with Differential/Platelet  3. Other headache syndrome We will need to exclude sinus headache If symptoms persist at next visit will consider addition of Topamax - fluticasone  (FLONASE) 50 MCG/ACT nasal spray; Place 2 sprays into both nostrils daily.  Dispense: 16 g; Refill: 1 - cetirizine (ZYRTEC) 10 MG tablet; Take 1 tablet (10 mg total) by mouth daily.  Dispense: 30 tablet; Refill: 1  4. Endometriosis Currently managed by GYN  5. Diabetes mellitus screening A1c of 5.8 - POCT glycosylated hemoglobin (Hb A1C)   Meds ordered this encounter  Medications  . fluticasone (FLONASE) 50 MCG/ACT nasal spray    Sig: Place 2 sprays into both nostrils daily.    Dispense:  16 g    Refill:  1  . cetirizine (ZYRTEC) 10 MG tablet    Sig: Take 1 tablet (10 mg total) by mouth daily.    Dispense:  30 tablet    Refill:  1  . DISCONTD: lisinopril-hydrochlorothiazide (ZESTORETIC) 20-12.5 MG tablet    Sig: Take 2 tablets by mouth daily.    Dispense:  60 tablet    Refill:  3    Discontinue losartan/HCTZ  . olmesartan-hydrochlorothiazide (BENICAR HCT) 40-25 MG tablet    Sig: Take 1 tablet by mouth daily.    Dispense:  30 tablet    Refill:  3    Discontinue lisinopril/HCTZ    Follow-up: Return in about 3 weeks (around 01/03/2018) for Follow-up of headaches.   Charlott Rakes MD

## 2017-12-13 NOTE — Patient Instructions (Signed)

## 2017-12-13 NOTE — Progress Notes (Signed)
Pt states she would like to talk to pcp about her bp  Pt states she has been having bad headaches to where she has to leave work to go go to lay down and to where she is seeing spots  Pt states she has been out of her bp pills for 2 days

## 2017-12-14 LAB — CMP14+EGFR
A/G RATIO: 1.3 (ref 1.2–2.2)
ALT: 17 IU/L (ref 0–32)
AST: 17 IU/L (ref 0–40)
Albumin: 3.9 g/dL (ref 3.5–5.5)
Alkaline Phosphatase: 59 IU/L (ref 39–117)
BUN/Creatinine Ratio: 14 (ref 9–23)
BUN: 9 mg/dL (ref 6–20)
Bilirubin Total: <0.2 mg/dL (ref 0.0–1.2)
CALCIUM: 9.3 mg/dL (ref 8.7–10.2)
CHLORIDE: 102 mmol/L (ref 96–106)
CO2: 25 mmol/L (ref 20–29)
Creatinine, Ser: 0.64 mg/dL (ref 0.57–1.00)
GFR, EST AFRICAN AMERICAN: 139 mL/min/{1.73_m2} (ref >59)
GFR, EST NON AFRICAN AMERICAN: 121 mL/min/{1.73_m2} (ref >59)
GLUCOSE: 74 mg/dL (ref 65–99)
Globulin, Total: 3 g/dL (ref 1.5–4.5)
Potassium: 4.3 mmol/L (ref 3.5–5.2)
Sodium: 141 mmol/L (ref 134–144)
TOTAL PROTEIN: 6.9 g/dL (ref 6.0–8.5)

## 2017-12-14 LAB — CBC WITH DIFFERENTIAL/PLATELET
BASOS ABS: 0 10*3/uL (ref 0.0–0.2)
BASOS: 0 %
EOS (ABSOLUTE): 0.2 10*3/uL (ref 0.0–0.4)
Eos: 2 %
HEMOGLOBIN: 12.4 g/dL (ref 11.1–15.9)
Hematocrit: 38.5 % (ref 34.0–46.6)
IMMATURE GRANS (ABS): 0 10*3/uL (ref 0.0–0.1)
IMMATURE GRANULOCYTES: 0 %
LYMPHS: 35 %
Lymphocytes Absolute: 3.9 10*3/uL — ABNORMAL HIGH (ref 0.7–3.1)
MCH: 26.8 pg (ref 26.6–33.0)
MCHC: 32.2 g/dL (ref 31.5–35.7)
MCV: 83 fL (ref 79–97)
MONOCYTES: 7 %
Monocytes Absolute: 0.8 10*3/uL (ref 0.1–0.9)
NEUTROS PCT: 56 %
Neutrophils Absolute: 6.2 10*3/uL (ref 1.4–7.0)
PLATELETS: 153 10*3/uL (ref 150–379)
RBC: 4.62 x10E6/uL (ref 3.77–5.28)
RDW: 14.2 % (ref 12.3–15.4)
WBC: 11.1 10*3/uL — ABNORMAL HIGH (ref 3.4–10.8)

## 2017-12-14 LAB — VITAMIN D 25 HYDROXY (VIT D DEFICIENCY, FRACTURES): VIT D 25 HYDROXY: 12.6 ng/mL — AB (ref 30.0–100.0)

## 2017-12-14 LAB — VITAMIN B12: VITAMIN B 12: 636 pg/mL (ref 232–1245)

## 2017-12-14 MED ORDER — ERGOCALCIFEROL 1.25 MG (50000 UT) PO CAPS: 50000.0000 [IU] | ORAL_CAPSULE | ORAL | 1 refills | Status: DC

## 2017-12-14 MED FILL — VIT D2 1.25 MG (50,000 UNIT: 1.25 MG | 28 days supply | Qty: 4 | Fill #0

## 2018-01-08 ENCOUNTER — Ambulatory Visit: Payer: Self-pay | Attending: Family Medicine | Admitting: Family Medicine

## 2018-01-08 ENCOUNTER — Encounter: Payer: Self-pay | Admitting: Family Medicine

## 2018-01-08 VITALS — BP 148/83 | HR 79 | Temp 97.7°F | Ht 67.0 in | Wt 312.2 lb

## 2018-01-08 DIAGNOSIS — Z9889 Other specified postprocedural states: Secondary | ICD-10-CM | POA: Insufficient documentation

## 2018-01-08 DIAGNOSIS — Z79899 Other long term (current) drug therapy: Secondary | ICD-10-CM | POA: Insufficient documentation

## 2018-01-08 DIAGNOSIS — G4489 Other headache syndrome: Secondary | ICD-10-CM | POA: Insufficient documentation

## 2018-01-08 DIAGNOSIS — I10 Essential (primary) hypertension: Secondary | ICD-10-CM | POA: Insufficient documentation

## 2018-01-08 MED ORDER — TOPIRAMATE 50 MG PO TABS: 100.0000 mg | ORAL_TABLET | Freq: Two times a day (BID) | ORAL | 3 refills | Status: DC

## 2018-01-08 MED FILL — CYCLOBENZAPRINE 10 MG TAB: 10 | 30 days supply | Qty: 60 | Fill #1

## 2018-01-08 MED FILL — VIT D2 1.25 MG (50,000 UNIT: 1.25 MG | 28 days supply | Qty: 4 | Fill #1

## 2018-01-08 MED FILL — TOPIRAMATE 50 MG TABLET: 50 | 30 days supply | Qty: 120 | Fill #0

## 2018-01-08 MED FILL — MELOXICAM 7.5 MG TABLET: 7.5 | 30 days supply | Qty: 30 | Fill #1

## 2018-01-08 MED FILL — TELMISARTAN-HCTZ 80-25 MG T: 80-25 | 30 days supply | Qty: 30 | Fill #1

## 2018-01-08 MED FILL — LARIN FE 1-20 TABLET: 1-20 | 28 days supply | Qty: 28 | Fill #5

## 2018-01-08 NOTE — Progress Notes (Signed)
Subjective:  Patient ID: Cathy Bryan, female    DOB: 29-May-1986  Age: 32 y.o. MRN: [id number]  CC: Headache   HPI Manroop Jakubowicz is a 32 year old female with a history of hypertension, obesity, depression here for follow-up of headaches.  She had been placed on Flonase and Zyrtec at her last visit but continues to experience generalized headaches about 3 times a week which resolve after she goes to lie down.  She had previously complained of lightheadedness at her last office visit. Denies nausea, vomiting, Photophobia. She has been switched from losartan/HCTZ to micardis/ HCTZ due to her concerns about recalls associated with the former and she reports doing well on the latter. She has no additional concerns today.   Past Medical History:  Diagnosis Date  . Anxiety   . Depression   . Endometriosis   . Hypertension     Past Surgical History:  Procedure Laterality Date  . APPENDECTOMY  04/19/2016   Procedure: APPENDECTOMY;  Surgeon: Lavonia Drafts, MD;  Location: [facility];  Service: Gynecology;;  . LAPAROTOMY Right 04/19/2016   Procedure: EXPLORATORY LAPAROTOMY WITH BILATERAL OOPHORECTOMY  LYSIS OF ADHESIONS;  Surgeon: Lavonia Drafts, MD;  Location: [facility];  Service: Gynecology;  Laterality: Right;    Allergies  Allergen Reactions  . Lisinopril Cough     Outpatient Medications Prior to Visit  Medication Sig Dispense Refill  . cetirizine (ZYRTEC) 10 MG tablet Take 1 tablet (10 mg total) by mouth daily. 30 tablet 1  . cyclobenzaprine (FLEXERIL) 10 MG tablet Take 1 tablet (10 mg total) by mouth 2 (two) times daily as needed for muscle spasms. 60 tablet 1  . ergocalciferol (DRISDOL) 50000 units capsule Take 1 capsule (50,000 Units total) by mouth once a week. 9 capsule 1  . fluticasone (FLONASE) 50 MCG/ACT nasal spray Place 2 sprays into both nostrils daily. 16 g 1  . meloxicam (MOBIC) 7.5 MG tablet Take 1 tablet (7.5 mg total) by mouth daily. 30 tablet 1  .  norethindrone-ethinyl estradiol (LOESTRIN FE 1/20) 1-20 MG-MCG tablet Take 1 tablet by mouth daily. 1 Package 11  . telmisartan-hydrochlorothiazide (MICARDIS HCT) 80-25 MG tablet Take 1 tablet by mouth daily. 30 tablet 3  . cephALEXin (KEFLEX) 500 MG capsule Take 1 capsule (500 mg total) by mouth 4 (four) times daily. (Patient not taking: Reported on 12/13/2017) 40 capsule 0  . mirtazapine (REMERON) 45 MG tablet Take 1 tablet (45 mg total) at bedtime by mouth. (Patient not taking: Reported on 12/13/2017) 90 tablet 1   No facility-administered medications prior to visit.     ROS Review of Systems  Constitutional: Negative for activity change, appetite change and fatigue.  HENT: Negative for congestion, sinus pressure and sore throat.   Eyes: Negative for visual disturbance.  Respiratory: Negative for cough, chest tightness, shortness of breath and wheezing.   Cardiovascular: Negative for chest pain and palpitations.  Gastrointestinal: Negative for abdominal distention, abdominal pain and constipation.  Endocrine: Negative for polydipsia.  Genitourinary: Negative for dysuria and frequency.  Musculoskeletal: Negative for arthralgias and back pain.  Skin: Negative for rash.  Neurological: Positive for headaches. Negative for tremors, light-headedness and numbness.  Hematological: Does not bruise/bleed easily.  Psychiatric/Behavioral: Negative for agitation and behavioral problems.    Objective:  BP (!) 148/83   Pulse 79   Temp 97.7 F (36.5 C) (Oral)   Ht 5\' 7"  (1.702 m)   Wt (!) 312 lb 3.2 oz (141.6 kg)   SpO2 100%   BMI 48.90  kg/m   BP/Weight 01/08/2018 12/13/2017 02/14/5408  Systolic BP 811 914 782  Diastolic BP 83 956 85  Wt. (Lbs) 312.2 315.4 308.25  BMI 48.9 49.4 48.28  Some encounter information is confidential and restricted. Go to Review Flowsheets activity to see all data.      Physical Exam  Constitutional: She is oriented to person, place, and time. She appears  well-developed and well-nourished.  Cardiovascular: Normal rate, normal heart sounds and intact distal pulses.  No murmur heard. Pulmonary/Chest: Effort normal and breath sounds normal. She has no wheezes. She has no rales. She exhibits no tenderness.  Abdominal: Soft. Bowel sounds are normal. She exhibits no distension and no mass. There is no tenderness.  Musculoskeletal: Normal range of motion.  Neurological: She is alert and oriented to person, place, and time. Coordination and gait normal.  Psychiatric: She has a normal mood and affect.     Assessment & Plan:   1. Essential hypertension Slightly elevated Doing well on Micardis/HCTZ Low sodium, DASH diet, lifestyle modifications  2. Other headache syndrome We will treat as tension headache as symptoms have not responded to use of allergy medications Discussed side effects of Topamax and she is currently on an oral contraceptive pill Topamax will also help with weight loss. - topiramate (TOPAMAX) 50 MG tablet; Take 2 tablets (100 mg total) by mouth 2 (two) times daily.  Dispense: 60 tablet; Refill: 3   Meds ordered this encounter  Medications  . topiramate (TOPAMAX) 50 MG tablet    Sig: Take 2 tablets (100 mg total) by mouth 2 (two) times daily.    Dispense:  60 tablet    Refill:  3    Follow-up: Return in about 3 months (around 04/09/2018) for Follow-up of chronic medical conditions.   Charlott Rakes MD

## 2018-01-08 NOTE — Patient Instructions (Signed)

## 2018-02-20 ENCOUNTER — Telehealth: Payer: Self-pay | Admitting: Obstetrics & Gynecology

## 2018-02-20 NOTE — Telephone Encounter (Signed)
Pt called stating she has not had a cycle for two months. Pt states she has been taking the meds that are supposed to make her have a cycle but it is not working. Gave pt appt 7/9 with [facility]. Pt had sx 2 yrs ago.

## 2018-02-21 MED FILL — LARIN FE 1-20 TABLET: 1-20 | 28 days supply | Qty: 28 | Fill #6

## 2018-02-21 MED FILL — VIT D2 1.25 MG (50,000 UNIT: 1.25 MG | 28 days supply | Qty: 4 | Fill #2

## 2018-02-21 MED FILL — TOPIRAMATE 50 MG TABLET: 50 | 30 days supply | Qty: 120 | Fill #1

## 2018-02-21 MED FILL — TELMISARTAN-HCTZ 80-25 MG T: 80-25 | 30 days supply | Qty: 30 | Fill #2

## 2018-03-20 ENCOUNTER — Ambulatory Visit: Payer: Self-pay | Admitting: Obstetrics & Gynecology

## 2018-04-11 ENCOUNTER — Ambulatory Visit: Payer: Self-pay | Admitting: Family Medicine

## 2018-04-23 ENCOUNTER — Telehealth: Payer: Self-pay | Admitting: Obstetrics & Gynecology

## 2018-04-23 ENCOUNTER — Other Ambulatory Visit: Payer: Self-pay | Admitting: Family Medicine

## 2018-04-23 DIAGNOSIS — G4489 Other headache syndrome: Secondary | ICD-10-CM

## 2018-04-23 MED FILL — TELMISARTAN-HCTZ 80-25 MG T: 80-25 | 30 days supply | Qty: 30 | Fill #3

## 2018-04-23 MED FILL — VIT D2 1.25 MG (50,000 UNIT: 1.25 MG | 28 days supply | Qty: 4 | Fill #3 | Status: TO

## 2018-04-23 NOTE — Telephone Encounter (Signed)
Patient is requesting for a nurse to call her back so that she can explain what's going on with her. She wanted to see Dr. Ihor Dow before she left the country. However, her next available appointment is not until after she (patient) will not be here.

## 2018-04-24 MED FILL — TOPIRAMATE 50 MG TABLET: 50 | 15 days supply | Qty: 60 | Fill #0

## 2018-04-25 NOTE — Telephone Encounter (Signed)
Called patient stating I am trying to reach you to return your phone call. Patient states she is about to move to [place] just wanted to see Dr Ihor Dow for an appt before she moved because she had some questions. Patient states she called our [place] office & got a sooner appt with her there. Patient had no questions.

## 2018-04-26 MED FILL — LARIN FE 1-20 TABLET: 1-20 | 28 days supply | Qty: 28 | Fill #7

## 2018-05-07 ENCOUNTER — Encounter: Payer: Self-pay | Admitting: Family Medicine

## 2018-05-07 ENCOUNTER — Ambulatory Visit: Payer: BC Managed Care – PPO | Attending: Family Medicine | Admitting: Family Medicine

## 2018-05-07 VITALS — BP 128/85 | HR 92 | Temp 98.0°F | Ht 67.0 in | Wt 300.2 lb

## 2018-05-07 DIAGNOSIS — F419 Anxiety disorder, unspecified: Secondary | ICD-10-CM | POA: Diagnosis not present

## 2018-05-07 DIAGNOSIS — I10 Essential (primary) hypertension: Secondary | ICD-10-CM | POA: Diagnosis not present

## 2018-05-07 DIAGNOSIS — E669 Obesity, unspecified: Secondary | ICD-10-CM | POA: Insufficient documentation

## 2018-05-07 DIAGNOSIS — Z6841 Body Mass Index (BMI) 40.0 and over, adult: Secondary | ICD-10-CM | POA: Insufficient documentation

## 2018-05-07 DIAGNOSIS — Z79899 Other long term (current) drug therapy: Secondary | ICD-10-CM | POA: Insufficient documentation

## 2018-05-07 DIAGNOSIS — F329 Major depressive disorder, single episode, unspecified: Secondary | ICD-10-CM | POA: Insufficient documentation

## 2018-05-07 DIAGNOSIS — Z888 Allergy status to other drugs, medicaments and biological substances status: Secondary | ICD-10-CM | POA: Diagnosis not present

## 2018-05-07 DIAGNOSIS — E559 Vitamin D deficiency, unspecified: Secondary | ICD-10-CM | POA: Insufficient documentation

## 2018-05-07 DIAGNOSIS — G4489 Other headache syndrome: Secondary | ICD-10-CM | POA: Insufficient documentation

## 2018-05-07 DIAGNOSIS — J0100 Acute maxillary sinusitis, unspecified: Secondary | ICD-10-CM | POA: Insufficient documentation

## 2018-05-07 DIAGNOSIS — R5383 Other fatigue: Secondary | ICD-10-CM | POA: Diagnosis present

## 2018-05-07 MED ORDER — TOPIRAMATE 100 MG PO TABS: 100.0000 mg | ORAL_TABLET | Freq: Two times a day (BID) | ORAL | 3 refills | Status: AC

## 2018-05-07 MED ORDER — CETIRIZINE HCL 10 MG PO TABS: 10.0000 mg | ORAL_TABLET | Freq: Every day | ORAL | 3 refills | Status: AC

## 2018-05-07 MED ORDER — ERGOCALCIFEROL 1.25 MG (50000 UT) PO CAPS: 50000.0000 [IU] | ORAL_CAPSULE | ORAL | 1 refills | Status: AC

## 2018-05-07 MED ORDER — TELMISARTAN-HCTZ 80-25 MG PO TABS: 1.0000 | ORAL_TABLET | Freq: Every day | ORAL | 3 refills | Status: AC

## 2018-05-07 MED ORDER — FLUTICASONE PROPIONATE 50 MCG/ACT NA SUSP: 2.0000 | Freq: Every day | NASAL | 1 refills | Status: DC

## 2018-05-07 NOTE — Progress Notes (Signed)
Patient has been feeling light headed for about 1 week.   Patient is also fatigue, and her bones hurt.

## 2018-05-07 NOTE — Patient Instructions (Signed)

## 2018-05-07 NOTE — Progress Notes (Signed)
Subjective:  Patient ID: Cathy Bryan, female    DOB: [birth year]  Age: 32 y.o. MRN: [id number]  CC: Fatigue   HPI Cathy Bryan is a 32 year old female with a history of hypertension, obesity, depression here for follow-up visit. She is doing well on Topamax which helps with her headaches. Today she complains of a one-week history of lightheadedness, fatigue, bones hurting and states she feels just like she fell prior to when vitamin D was initiated for her vitamin D deficiency.  She also has facial pressure and has not been too compliant with her Flonase and Zyrtec. She is currently on oral contraceptive pills to regularize her periods but informs me she does not have regular periods as she sometimes has brownish blood but no red blood; is an upcoming appointment with GYN. She will be taking a trip to [place] next month and is wondering if she can get a medication which would prevent her from getting sick in the event that she ingests a bad food; informs me her friend received a prescription medication for this but she does not recall the name.  I have advised her to obtain the proper information and get back to the clinic with this. She will be relocating to [place] in 2 months as she recently got offered a job there.  Past Medical History:  Diagnosis Date  . Anxiety   . Depression   . Endometriosis   . Hypertension     Past Surgical History:  Procedure Laterality Date  . APPENDECTOMY  04/19/2016   Procedure: APPENDECTOMY;  Surgeon: Lavonia Drafts, MD;  Location: [facility];  Service: Gynecology;;  . LAPAROTOMY Right 04/19/2016   Procedure: EXPLORATORY LAPAROTOMY WITH BILATERAL OOPHORECTOMY  LYSIS OF ADHESIONS;  Surgeon: Lavonia Drafts, MD;  Location: [facility];  Service: Gynecology;  Laterality: Right;    Allergies  Allergen Reactions  . Lisinopril Cough     Outpatient Medications Prior to Visit  Medication Sig Dispense Refill  . cyclobenzaprine (FLEXERIL) 10 MG  tablet Take 1 tablet (10 mg total) by mouth 2 (two) times daily as needed for muscle spasms. 60 tablet 1  . meloxicam (MOBIC) 7.5 MG tablet Take 1 tablet (7.5 mg total) by mouth daily. 30 tablet 1  . norethindrone-ethinyl estradiol (LOESTRIN FE 1/20) 1-20 MG-MCG tablet Take 1 tablet by mouth daily. 1 Package 11  . cetirizine (ZYRTEC) 10 MG tablet Take 1 tablet (10 mg total) by mouth daily. 30 tablet 1  . ergocalciferol (DRISDOL) 50000 units capsule Take 1 capsule (50,000 Units total) by mouth once a week. 9 capsule 1  . telmisartan-hydrochlorothiazide (MICARDIS HCT) 80-25 MG tablet Take 1 tablet by mouth daily. 30 tablet 3  . topiramate (TOPAMAX) 50 MG tablet TAKE 2 TABLETS BY MOUTH 2 (TWO) TIMES DAILY. 60 tablet 0  . mirtazapine (REMERON) 45 MG tablet Take 1 tablet (45 mg total) at bedtime by mouth. (Patient not taking: Reported on 12/13/2017) 90 tablet 1  . cephALEXin (KEFLEX) 500 MG capsule Take 1 capsule (500 mg total) by mouth 4 (four) times daily. (Patient not taking: Reported on 12/13/2017) 40 capsule 0  . fluticasone (FLONASE) 50 MCG/ACT nasal spray Place 2 sprays into both nostrils daily. (Patient not taking: Reported on 05/07/2018) 16 g 1   No facility-administered medications prior to visit.     ROS Review of Systems  Constitutional: Positive for fatigue. Negative for activity change and appetite change.  HENT: Positive for sinus pressure. Negative for congestion and sore throat.   Eyes: Negative  for visual disturbance.  Respiratory: Negative for cough, chest tightness, shortness of breath and wheezing.   Cardiovascular: Negative for chest pain and palpitations.  Gastrointestinal: Negative for abdominal distention, abdominal pain and constipation.  Endocrine: Negative for polydipsia.  Genitourinary: Negative for dysuria and frequency.  Musculoskeletal: Positive for arthralgias and myalgias. Negative for back pain.  Skin: Negative for rash.  Neurological: Negative for tremors,  light-headedness and numbness.  Hematological: Does not bruise/bleed easily.  Psychiatric/Behavioral: Negative for agitation and behavioral problems.    Objective:  BP 128/85   Pulse 92   Temp 98 F (36.7 C) (Oral)   Ht 5\' 7"  (1.702 m)   Wt (!) 300 lb 3.2 oz (136.2 kg)   SpO2 98%   BMI 47.02 kg/m   BP/Weight 05/07/2018 0/86/5784 02/18/6294  Systolic BP 284 132 440  Diastolic BP 85 83 102  Wt. (Lbs) 300.2 312.2 315.4  BMI 47.02 48.9 49.4  Some encounter information is confidential and restricted. Go to Review Flowsheets activity to see all data.      Physical Exam  Constitutional: She is oriented to person, place, and time. She appears well-developed and well-nourished.  HENT:  Right Ear: External ear normal.  Left Ear: External ear normal.  Mouth/Throat: Oropharynx is clear and moist.  Bilateral maxillary sinus tenderness Bilaterally enlarged tonsils, no inflammation or exudates.  Eyes: Conjunctivae are normal.  Cardiovascular: Normal rate, normal heart sounds and intact distal pulses.  No murmur heard. Pulmonary/Chest: Effort normal and breath sounds normal. She has no wheezes. She has no rales. She exhibits no tenderness.  Abdominal: Soft. Bowel sounds are normal. She exhibits no distension and no mass. There is no tenderness.  Musculoskeletal: Normal range of motion.  Neurological: She is alert and oriented to person, place, and time.  Skin: Skin is warm and dry.  Psychiatric: She has a normal mood and affect.     Assessment & Plan:   1. Other headache syndrome Doing well on Topamax - fluticasone (FLONASE) 50 MCG/ACT nasal spray; Place 2 sprays into both nostrils daily.  Dispense: 16 g; Refill: 1 - cetirizine (ZYRTEC) 10 MG tablet; Take 1 tablet (10 mg total) by mouth daily.  Dispense: 30 tablet; Refill: 3 - topiramate (TOPAMAX) 100 MG tablet; Take 1 tablet (100 mg total) by mouth 2 (two) times daily.  Dispense: 60 tablet; Refill: 3  2. Acute non-recurrent  maxillary sinusitis This could explain her fatigue and sinus pressure Advised to resume Flonase and Zyrtec, also to take Tylenol as needed and increase fluid intake. - CBC with Differential/Platelet  3. Essential hypertension Controlled Counseled on blood pressure goal of less than 130/80, low-sodium, DASH diet, medication compliance, 150 minutes of moderate intensity exercise per week. Discussed medication compliance, adverse effects. - telmisartan-hydrochlorothiazide (MICARDIS HCT) 80-25 MG tablet; Take 1 tablet by mouth daily.  Dispense: 30 tablet; Refill: 3  4. Vitamin D deficiency - ergocalciferol (DRISDOL) 50000 units capsule; Take 1 capsule (50,000 Units total) by mouth once a week.  Dispense: 9 capsule; Refill: 1   Meds ordered this encounter  Medications  . fluticasone (FLONASE) 50 MCG/ACT nasal spray    Sig: Place 2 sprays into both nostrils daily.    Dispense:  16 g    Refill:  1  . telmisartan-hydrochlorothiazide (MICARDIS HCT) 80-25 MG tablet    Sig: Take 1 tablet by mouth daily.    Dispense:  30 tablet    Refill:  3  . ergocalciferol (DRISDOL) 50000 units capsule    Sig:  Take 1 capsule (50,000 Units total) by mouth once a week.    Dispense:  9 capsule    Refill:  1  . cetirizine (ZYRTEC) 10 MG tablet    Sig: Take 1 tablet (10 mg total) by mouth daily.    Dispense:  30 tablet    Refill:  3  . topiramate (TOPAMAX) 100 MG tablet    Sig: Take 1 tablet (100 mg total) by mouth 2 (two) times daily.    Dispense:  60 tablet    Refill:  3    Follow-up: Return in about 3 months (around 08/07/2018) for follow up of chronic medical conditions.   Charlott Rakes MD

## 2018-05-08 LAB — CBC WITH DIFFERENTIAL/PLATELET
BASOS ABS: 0 10*3/uL (ref 0.0–0.2)
Basos: 0 %
EOS (ABSOLUTE): 0.1 10*3/uL (ref 0.0–0.4)
EOS: 1 %
HEMATOCRIT: 38.8 % (ref 34.0–46.6)
HEMOGLOBIN: 12.9 g/dL (ref 11.1–15.9)
IMMATURE GRANS (ABS): 0 10*3/uL (ref 0.0–0.1)
Immature Granulocytes: 0 %
LYMPHS: 30 %
Lymphocytes Absolute: 2.7 10*3/uL (ref 0.7–3.1)
MCH: 27.1 pg (ref 26.6–33.0)
MCHC: 33.2 g/dL (ref 31.5–35.7)
MCV: 82 fL (ref 79–97)
MONOCYTES: 7 %
Monocytes Absolute: 0.7 10*3/uL (ref 0.1–0.9)
Neutrophils Absolute: 5.6 10*3/uL (ref 1.4–7.0)
Neutrophils: 62 %
Platelets: 152 10*3/uL (ref 150–450)
RBC: 4.76 x10E6/uL (ref 3.77–5.28)
RDW: 13 % (ref 12.3–15.4)
WBC: 9.1 10*3/uL (ref 3.4–10.8)

## 2018-05-09 ENCOUNTER — Ambulatory Visit (INDEPENDENT_AMBULATORY_CARE_PROVIDER_SITE_OTHER): Payer: BC Managed Care – PPO | Admitting: Obstetrics & Gynecology

## 2018-05-09 ENCOUNTER — Encounter: Payer: Self-pay | Admitting: Obstetrics & Gynecology

## 2018-05-09 DIAGNOSIS — R232 Flushing: Secondary | ICD-10-CM

## 2018-05-09 DIAGNOSIS — N809 Endometriosis, unspecified: Secondary | ICD-10-CM

## 2018-05-09 DIAGNOSIS — Z01419 Encounter for gynecological examination (general) (routine) without abnormal findings: Secondary | ICD-10-CM | POA: Diagnosis not present

## 2018-05-09 DIAGNOSIS — R102 Pelvic and perineal pain: Secondary | ICD-10-CM

## 2018-05-09 DIAGNOSIS — D251 Intramural leiomyoma of uterus: Secondary | ICD-10-CM

## 2018-05-09 DIAGNOSIS — D250 Submucous leiomyoma of uterus: Secondary | ICD-10-CM

## 2018-05-09 MED ORDER — NORETHIN ACE-ETH ESTRAD-FE 1-20 MG-MCG PO TABS: 1.0000 | ORAL_TABLET | Freq: Every day | ORAL | 4 refills | Status: DC

## 2018-05-09 NOTE — Patient Instructions (Signed)
Endometriosis Endometriosis is a condition in which the tissue that lines the uterus (endometrium) grows outside of its normal location. The tissue may grow in many locations close to the uterus, but it commonly grows on the ovaries, fallopian tubes, vagina, or bowel. When the uterus sheds the endometrium every menstrual cycle, there is bleeding wherever the endometrial tissue is located. This can cause pain because blood is irritating to tissues that are not normally exposed to it. What are the causes? The cause of endometriosis is not known. What increases the risk? You may be more likely to develop endometriosis if you:  Have a family history of endometriosis.  Have never given birth.  Started your period at age 10 or younger.  Have high levels of estrogen in your body.  Were exposed to a certain medicine (diethylstilbestrol) before you were born (in utero).  Had low birth weight.  Were born as a twin, triplet, or other multiple.  Have a BMI of less than 25. BMI is an estimate of body fat and is calculated from height and weight.  What are the signs or symptoms? Often, there are no symptoms of this condition. If you do have symptoms, they may:  Vary depending on where your endometrial tissue is growing.  Occur during your menstrual period (most common) or midcycle.  Come and go, or you may go months with no symptoms at all.  Stop with menopause.  Symptoms may include:  Pain in the back or abdomen.  Heavier bleeding during periods.  Pain during sex.  Painful bowel movements.  Infertility.  Pelvic pain.  Bleeding more than once a month.  How is this diagnosed? This condition is diagnosed based on your symptoms and a physical exam. You may have tests, such as:  Blood tests and urine tests. These may be done to help rule out other possible causes of your symptoms.  Ultrasound, to look for abnormal tissues.  An X-ray of the lower bowel (barium enema).  An  ultrasound that is done through the vagina (transvaginally).  CT scan.  MRI.  Laparoscopy. In this procedure, a lighted, pencil-sized instrument called a laparoscope is inserted into your abdomen through an incision. The laparoscope allows your health care provider to look at the organs inside your body and check for abnormal tissue to confirm the diagnosis. If abnormal tissue is found, your health care provider may remove a small piece of tissue (biopsy) to be examined under a microscope.  How is this treated? Treatment for this condition may include:  Medicines to relieve pain, such as NSAIDs.  Hormone therapy. This involves using artificial (synthetic) hormones to reduce endometrial tissue growth. Your health care provider may recommend using a hormonal form of birth control, or other medicines.  Surgery. This may be done to remove abnormal endometrial tissue. ? In some cases, tissue may be removed using a laparoscope and a laser (laparoscopic laser treatment). ? In severe cases, surgery may be done to remove the fallopian tubes, uterus, and ovaries (hysterectomy).  Follow these instructions at home:  Take over-the-counter and prescription medicines only as told by your health care provider.  Do not drive or use heavy machinery while taking prescription pain medicine.  Try to avoid activities that cause pain, including sexual activity.  Keep all follow-up visits as told by your health care provider. This is important. Contact a health care provider if:  You have pain in the area between your hip bones (pelvic area) that occurs: ? Before, during, or   after your period. ? In between your period and gets worse during your period. ? During or after sex. ? With bowel movements or urination, especially during your period.  You have problems getting pregnant.  You have a fever. Get help right away if:  You have severe pain that does not get better with medicine.  You have severe  nausea and vomiting, or you cannot eat without vomiting.  You have pain that affects only the lower, right side of your abdomen.  You have abdominal pain that gets worse.  You have abdominal swelling.  You have blood in your stool. This information is not intended to replace advice given to you by your health care provider. Make sure you discuss any questions you have with your health care provider. Document Released: 08/26/2000 Document Revised: 06/03/2016 Document Reviewed: 01/30/2016 Elsevier Interactive Patient Education  2018 [employer]  

## 2018-05-09 NOTE — Progress Notes (Signed)
Subjective:     Cathy Bryan is a 32 y.o. female here for a routine exam. LMP 3-4 months prev. Current complaints: pt here for routine eval . She reports intermittent pain ~3x/month. It is sharp but, fleeting <35min.  Resolves with rest. Pt does not need ot take meds. This does not limit her daily activities.    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 She will be moving to [place] in Oct. Pt not sexually active with males. Pt does c/o occ hot flushes but, they are imporved since she's been on OCPs.    Pt is s/p 04/19/2016 EXPLORATORY LAPAROTOMY WITH BILATERAL OOPHORECTOMY  LYSIS OF ADHESIONS (Right);APPENDECTOMY (there was a portion of the left ovary remaining)   Gynecologic History No LMP recorded. Contraception: same sex On OCPs for control of pain and endometriosis.   Last Pap: 09/19/2016. Results were: normal Last mammogram: n/a.   Obstetric History OB History  Gravida Para Term Preterm AB Living  0 0 0 0 0 0  SAB TAB Ectopic Multiple Live Births  0 0 0 0     The following portions of the patient's history were reviewed and updated as appropriate: allergies, current medications, past family history, past medical history, past social history, past surgical history and problem list.  Review of Systems Pertinent items are noted in HPI.    Objective:  BP 120/71   Pulse 80   Ht 5\' 7"  (1.702 m)   Wt (!) 303 lb (137.4 kg)   BMI 47.46 kg/m   General Appearance:    Alert, cooperative, no distress, appears stated age  Head:    Normocephalic, without obvious abnormality, atraumatic  Eyes:    conjunctiva/corneas clear, EOM's intact, both eyes  Ears:    Normal external ear canals, both ears  Nose:   Nares normal, septum midline, mucosa normal, no drainage    or sinus tenderness  Throat:   Lips, mucosa, and tongue normal; teeth and gums normal  Neck:   Supple, symmetrical, trachea midline, no adenopathy;    thyroid:  no enlargement/tenderness/nodules  Back:     Symmetric,  no curvature, ROM normal, no CVA tenderness  Lungs:     Clear to auscultation bilaterally, respirations unlabored  Chest Wall:    No tenderness or deformity   Heart:    Regular rate and rhythm, S1 and S2 normal, no murmur, rub   or gallop  Breast Exam:    No tenderness, masses, or nipple abnormality  Abdomen:     Soft, non-tender, bowel sounds active all four quadrants,    no masses, no organomegaly  Genitalia:    Normal female without lesion, discharge or tenderness     Extremities:   Extremities normal, atraumatic, no cyanosis or edema  Pulses:   2+ and symmetric all extremities  Skin:   Skin color, texture, turgor normal, no rashes or lesions     Assessment:    Healthy female exam.   h/o endometriosis Pelvic pain- improved on OCPs   Plan:   PAP due 09/2019 Keep LoEstrin 1/20. Refills given  Discussed with pt finding a REI when she desires to get pregnant. Told that donor eggs will likely be needed.   Pamila Mendibles L. [name], M.D., Cherlynn June

## 2018-05-23 ENCOUNTER — Encounter: Payer: Self-pay | Admitting: Family Medicine

## 2018-06-27 ENCOUNTER — Other Ambulatory Visit: Payer: Self-pay | Admitting: Family Medicine

## 2018-09-19 ENCOUNTER — Other Ambulatory Visit: Payer: Self-pay

## 2018-09-19 DIAGNOSIS — D251 Intramural leiomyoma of uterus: Secondary | ICD-10-CM

## 2018-09-19 DIAGNOSIS — D250 Submucous leiomyoma of uterus: Secondary | ICD-10-CM

## 2018-09-19 DIAGNOSIS — R102 Pelvic and perineal pain: Secondary | ICD-10-CM

## 2018-09-19 DIAGNOSIS — N809 Endometriosis, unspecified: Secondary | ICD-10-CM

## 2018-09-19 DIAGNOSIS — R232 Flushing: Secondary | ICD-10-CM

## 2018-09-20 MED ORDER — NORETHIN ACE-ETH ESTRAD-FE 1-20 MG-MCG PO TABS: 1.0000 | ORAL_TABLET | Freq: Every day | ORAL | 8 refills | Status: AC

## 2019-01-09 ENCOUNTER — Other Ambulatory Visit: Payer: Self-pay | Admitting: Family Medicine

## 2019-01-09 DIAGNOSIS — E559 Vitamin D deficiency, unspecified: Secondary | ICD-10-CM

## 2019-07-02 ENCOUNTER — Telehealth: Payer: Self-pay

## 2019-07-02 NOTE — Telephone Encounter (Signed)
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 Pt states  she would like to see Dr. Maylene Roes- [facility] Julian this week. Explained to pt that we are not able to get her in this week but she is welcome to schedule for a later date. Pt states she will be going back to [place] tomorrow and she will see her doctor in [place], [place]
